# Patient Record
Sex: Female | Born: 1990 | Race: Black or African American | Hispanic: No | Marital: Single | Smoking: Never smoker
Health system: Southern US, Community
[De-identification: ages and names within clinical notes are randomized; demographics above are authoritative.]

## PROBLEM LIST (undated history)

## (undated) DIAGNOSIS — Z789 Other specified health status: Secondary | ICD-10-CM

## (undated) HISTORY — PX: NO PAST SURGERIES: SHX2092

---

## 2012-05-27 ENCOUNTER — Emergency Department (HOSPITAL_COMMUNITY)
Admission: EM | Admit: 2012-05-27 | Discharge: 2012-05-27 | Disposition: A | Discharge status: Home / Self Care | Payer: BC Managed Care – PPO | Attending: Emergency Medicine | Admitting: Emergency Medicine

## 2012-05-27 ENCOUNTER — Encounter (HOSPITAL_COMMUNITY): Payer: Self-pay | Admitting: *Deleted

## 2012-05-27 DIAGNOSIS — R109 Unspecified abdominal pain: Secondary | ICD-10-CM | POA: Insufficient documentation

## 2012-05-27 LAB — URINALYSIS, ROUTINE W REFLEX MICROSCOPIC
Glucose, UA: NEGATIVE mg/dL
Ketones, ur: NEGATIVE mg/dL
Protein, ur: NEGATIVE mg/dL

## 2012-05-27 LAB — COMPREHENSIVE METABOLIC PANEL
Alkaline Phosphatase: 59 U/L (ref 39–117)
BUN: 6 mg/dL (ref 6–23)
Calcium: 9.4 mg/dL (ref 8.4–10.5)
GFR calc Af Amer: >90 mL/min (ref >90)
GFR calc non Af Amer: >90 mL/min (ref >90)
Glucose, Bld: 105 mg/dL — ABNORMAL HIGH (ref 70–99)
Potassium: 4.3 mEq/L (ref 3.5–5.1)
Total Protein: 7.3 g/dL (ref 6.0–8.3)

## 2012-05-27 LAB — POCT PREGNANCY, URINE: Preg Test, Ur: NEGATIVE

## 2012-05-27 LAB — WET PREP, GENITAL
Trich, Wet Prep: NONE SEEN
Yeast Wet Prep HPF POC: NONE SEEN

## 2012-05-27 LAB — CBC WITH DIFFERENTIAL/PLATELET
Basophils Absolute: 0 10*3/uL (ref 0.0–0.1)
HCT: 36.8 % (ref 36.0–46.0)
Lymphocytes Relative: 5 % — ABNORMAL LOW (ref 12–46)
Monocytes Absolute: 0.4 10*3/uL (ref 0.1–1.0)
Neutro Abs: 9 10*3/uL — ABNORMAL HIGH (ref 1.7–7.7)
Neutrophils Relative %: 91 % — ABNORMAL HIGH (ref 43–77)
RDW: 12 % (ref 11.5–15.5)
WBC: 9.9 10*3/uL (ref 4.0–10.5)

## 2012-05-27 MED ORDER — IBUPROFEN 800 MG PO TABS: 800.0000 mg | ORAL_TABLET | Freq: Three times a day (TID) | ORAL | Status: AC

## 2012-05-27 NOTE — ED Notes (Signed)
Pt reports being on her menstrual cycle, having severe lower abd cramping today and n/v. Denies diarrhea or urinary symptoms. No acute distress noted at triage.

## 2012-05-27 NOTE — ED Provider Notes (Signed)
History     CSN: [id number]  Arrival date & time 05/27/12  1411   First MD Initiated Contact with Patient 05/27/12 1522      Chief Complaint  Patient presents with  . Abdominal Pain  . Emesis    (Consider location/radiation/quality/duration/timing/severity/associated sxs/prior treatment) HPI Comments: Patient presents today with a chief complaint of abdominal pain that began this evening.  Pain located in the suprapubic area.  She started her menstrual cycle earlier today.  She reports that the pain feels like a sharp crampy pain.  This pain is different and more severe than the pain that she typically has with her menstrual cycle.  She also reports that she had one episode of vomiting earlier today.  No blood in her emesis.  She denies diarrhea.  She had a regular soft BM this morning.  No blood in the stool.  No prior abdominal surgeries.    Patient is a 21 y.o. female presenting with abdominal pain. The history is provided by the patient.  Abdominal Pain The primary symptoms of the illness include abdominal pain, nausea and vomiting. The primary symptoms of the illness do not include fever, shortness of breath, diarrhea, hematemesis, hematochezia, dysuria or vaginal discharge. The onset of the illness was gradual. The problem has been gradually worsening.  The patient states that she believes she is currently not pregnant. The patient has not had a change in bowel habit. Symptoms associated with the illness do not include chills, constipation, urgency or frequency.    History reviewed. No pertinent past medical history.  History reviewed. No pertinent past surgical history.  History reviewed. No pertinent family history.  History  Substance Use Topics  . Smoking status: Not on file  . Smokeless tobacco: Not on file  . Alcohol Use: No    OB History    Grav Para Term Preterm Abortions TAB SAB Ect Mult Living                  Review of Systems  Constitutional: Negative for  fever and chills.  Respiratory: Negative for shortness of breath.   Gastrointestinal: Positive for nausea, vomiting and abdominal pain. Negative for diarrhea, constipation, blood in stool, hematochezia, abdominal distention and hematemesis.  Genitourinary: Negative for dysuria, urgency, frequency, flank pain, vaginal discharge, difficulty urinating and vaginal pain.  Neurological: Negative for dizziness, syncope and light-headedness.    Allergies  Review of patient's allergies indicates no known allergies.  Home Medications   Current Outpatient Rx  Name Route Sig Dispense Refill  . HYDROCODONE-ACETAMINOPHEN 5-325 MG PO TABS Oral Take 1 tablet by mouth every 6 (six) hours as needed. For pain    . IBUPROFEN 200 MG PO TABS Oral Take 400 mg by mouth every 6 (six) hours as needed. For pain      BP 118/73  Pulse 62  Temp 97.4 F (36.3 C) (Oral)  Resp 20  SpO2 100%  LMP 05/27/2012  Physical Exam  Nursing note and vitals reviewed. Constitutional: She appears well-developed and well-nourished. No distress.  HENT:  Head: Normocephalic and atraumatic.  Mouth/Throat: Oropharynx is clear and moist.  Neck: Normal range of motion. Neck supple.  Cardiovascular: Normal rate, regular rhythm and normal heart sounds.   Pulmonary/Chest: Effort normal and breath sounds normal. No respiratory distress. She has no wheezes.  Abdominal: Soft. Bowel sounds are normal. She exhibits no distension and no mass. There is tenderness in the suprapubic area. There is no rigidity, no rebound, no guarding, no tenderness  at McBurney's point and negative Murphy's sign.  Genitourinary: There is no rash, tenderness or lesion on the right labia. There is no rash, tenderness or lesion on the left labia. Uterus is not enlarged. Cervix exhibits no motion tenderness and no discharge. Right adnexum displays no mass, no tenderness and no fullness. Left adnexum displays no mass, no tenderness and no fullness. There is bleeding  around the vagina.  Neurological: She is alert.  Skin: Skin is warm and dry. She is not diaphoretic.  Psychiatric: She has a normal mood and affect.    ED Course  Procedures (including critical care time)  Labs Reviewed  URINALYSIS, ROUTINE W REFLEX MICROSCOPIC - Abnormal; Notable for the following:    APPearance CLOUDY (*)     Hgb urine dipstick LARGE (*)     All other components within normal limits  URINE MICROSCOPIC-ADD ON  POCT PREGNANCY, URINE  GC/CHLAMYDIA PROBE AMP, GENITAL  WET PREP, GENITAL  COMPREHENSIVE METABOLIC PANEL  CBC WITH DIFFERENTIAL   No results found.   No diagnosis found.    MDM  Patient presenting today with lower abdominal sharp cramping.  Patient started her menstrual cycle earlier today.  No tenderness on pelvic exam.  No rebound or guarding on abdominal exam.  Patient afebrile.  Labs unremarkable.  UA negative.  Urine pregnancy negative.  Therefore, do not feel that any imaging is indicated at this time.  Patient discharged home.  Return precautions discussed with the patient.          Pascal Lux [place], PA-C 05/28/12 1217

## 2012-05-28 NOTE — ED Provider Notes (Signed)
Medical screening examination/treatment/procedure(s) were performed by non-physician practitioner and as supervising physician I was immediately available for consultation/collaboration.   Loren Racer, MD 05/28/12 [phone]

## 2012-05-29 LAB — GC/CHLAMYDIA PROBE AMP, GENITAL: GC Probe Amp, Genital: NEGATIVE

## 2013-02-08 ENCOUNTER — Encounter (HOSPITAL_COMMUNITY): Payer: Self-pay | Admitting: Emergency Medicine

## 2013-02-08 ENCOUNTER — Emergency Department (HOSPITAL_COMMUNITY)
Admission: EM | Admit: 2013-02-08 | Discharge: 2013-02-08 | Disposition: A | Discharge status: Home / Self Care | Payer: BC Managed Care – PPO | Attending: Emergency Medicine | Admitting: Emergency Medicine

## 2013-02-08 DIAGNOSIS — IMO0002 Reserved for concepts with insufficient information to code with codable children: Secondary | ICD-10-CM | POA: Insufficient documentation

## 2013-02-08 DIAGNOSIS — S199XXA Unspecified injury of neck, initial encounter: Secondary | ICD-10-CM | POA: Insufficient documentation

## 2013-02-08 DIAGNOSIS — Y92410 Unspecified street and highway as the place of occurrence of the external cause: Secondary | ICD-10-CM | POA: Insufficient documentation

## 2013-02-08 DIAGNOSIS — Y939 Activity, unspecified: Secondary | ICD-10-CM | POA: Insufficient documentation

## 2013-02-08 DIAGNOSIS — S0993XA Unspecified injury of face, initial encounter: Secondary | ICD-10-CM | POA: Insufficient documentation
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
## 2013-02-09 NOTE — ED Provider Notes (Signed)
Medical screening examination/treatment/procedure(s) were performed by non-physician practitioner and as supervising physician I was immediately available for consultation/collaboration.   Michal Callicott III, MD 02/09/13 1234 

## 2015-01-19 ENCOUNTER — Emergency Department (INDEPENDENT_AMBULATORY_CARE_PROVIDER_SITE_OTHER)
Admission: EM | Admit: 2015-01-19 | Discharge: 2015-01-19 | Disposition: A | Discharge status: Home / Self Care | Payer: PRIVATE HEALTH INSURANCE | Source: Home / Self Care | Attending: Family Medicine | Admitting: Family Medicine

## 2015-01-19 ENCOUNTER — Encounter (HOSPITAL_COMMUNITY): Payer: Self-pay | Admitting: Emergency Medicine

## 2015-01-19 DIAGNOSIS — N390 Urinary tract infection, site not specified: Secondary | ICD-10-CM

## 2015-01-19 LAB — POCT PREGNANCY, URINE: PREG TEST UR: NEGATIVE

## 2015-01-19 LAB — POCT URINALYSIS DIP (DEVICE)
Bilirubin Urine: NEGATIVE
GLUCOSE, UA: NEGATIVE mg/dL
KETONES UR: NEGATIVE mg/dL
NITRITE: NEGATIVE
PH: 5.5 (ref 5.0–8.0)
Protein, ur: 30 mg/dL — AB
Specific Gravity, Urine: 1.015 (ref 1.005–1.030)
Urobilinogen, UA: 0.2 mg/dL (ref 0.0–1.0)

## 2015-01-19 MED ORDER — CEFUROXIME AXETIL 250 MG PO TABS: 250.0000 mg | ORAL_TABLET | Freq: Two times a day (BID) | ORAL | Status: DC

## 2015-01-19 NOTE — ED Notes (Signed)
C/o  Dysuria and hematuria.  On set Friday.  Denies lower back pain.  No fever, n/v/d.  Pt is taking uristat with no relief.

## 2015-01-19 NOTE — ED Provider Notes (Signed)
CSN: [id number]     Arrival date & time 01/19/15  1546 History   First MD Initiated Contact with Patient 01/19/15 1631     Chief Complaint  Patient presents with  . Urinary Tract Infection   (Consider location/radiation/quality/duration/timing/severity/associated sxs/prior Treatment) HPI          24 year old female presents complaining of lower abdominal discomfort, hematuria, dysuria, frequency, urgency. Symptoms started 3 days ago and have waxed and waned since then. She has been taking over-the-counter AZO with some relief of her symptoms as well as increasing fluids. This afternoon she felt like her symptoms were getting much worse so she wanted to be checked out. No fever, NVD, or flank pain  History reviewed. No pertinent past medical history. History reviewed. No pertinent past surgical history. No family history on file. History  Substance Use Topics  . Smoking status: Never Smoker   . Smokeless tobacco: Not on file  . Alcohol Use: Yes   OB History    No data available     Review of Systems  Genitourinary: Positive for dysuria, urgency, frequency and hematuria. Negative for flank pain, vaginal discharge, vaginal pain and pelvic pain.  All other systems reviewed and are negative.   Allergies  Review of patient's allergies indicates no known allergies.  Home Medications   Prior to Admission medications   Medication Sig Start Date End Date Taking? Authorizing Provider  cefUROXime (CEFTIN) 250 MG tablet Take 1 tablet (250 mg total) by mouth 2 (two) times daily with a meal. 01/19/15   Graylon GoodZachary H Terrelle Ruffolo, PA-C  ibuprofen (ADVIL,MOTRIN) 200 MG tablet Take 200 mg by mouth every 8 (eight) hours as needed for pain.    Historical Provider, MD   BP 111/67 mmHg  Pulse 66  Temp(Src) 99 F (37.2 C) (Oral)  Resp 16  SpO2 99%  LMP 12/21/2014 Physical Exam  Constitutional: She is oriented to person, place, and time. Vital signs are normal. She appears well-developed and  well-nourished. No distress.  HENT:  Head: Normocephalic and atraumatic.  Pulmonary/Chest: Effort normal. No respiratory distress.  Abdominal: Soft. Bowel sounds are normal. There is no tenderness. There is no rebound, no guarding and no CVA tenderness.  Neurological: She is alert and oriented to person, place, and time. She has normal strength. Coordination normal.  Skin: Skin is warm and dry. No rash noted. She is not diaphoretic.  Psychiatric: She has a normal mood and affect. Judgment normal.  Nursing note and vitals reviewed.   ED Course  Procedures (including critical care time) Labs Review Labs Reviewed  POCT URINALYSIS DIP (DEVICE) - Abnormal; Notable for the following:    Hgb urine dipstick LARGE (*)    Protein, ur 30 (*)    Leukocytes, UA MODERATE (*)    All other components within normal limits  URINE CULTURE  POCT PREGNANCY, URINE    Imaging Review No results found.   MDM   1. UTI (lower urinary tract infection)    Urine culture sent. Treat with Ceftin, increase fluids, continue over-the-counter AZO when necessary. Follow-up if any worsening   Meds ordered this encounter  Medications  . cefUROXime (CEFTIN) 250 MG tablet    Sig: Take 1 tablet (250 mg total) by mouth 2 (two) times daily with a meal.    Dispense:  10 tablet    Refill:  0       Graylon GoodZachary H Lorena Benham, PA-C 01/19/15 1654

## 2015-01-19 NOTE — Discharge Instructions (Signed)

## 2015-01-22 LAB — URINE CULTURE

## 2015-01-22 NOTE — ED Notes (Signed)
Urine culture: >100,000 colonies E. Coli.  Pt. adequately treated with Ceftin. erythromycin 01/22/2015

## 2015-01-23 ENCOUNTER — Emergency Department (HOSPITAL_COMMUNITY): Payer: No Typology Code available for payment source

## 2015-01-23 ENCOUNTER — Encounter (HOSPITAL_COMMUNITY): Payer: Self-pay | Admitting: Emergency Medicine

## 2015-01-23 ENCOUNTER — Emergency Department (HOSPITAL_COMMUNITY)
Admission: EM | Admit: 2015-01-23 | Discharge: 2015-01-23 | Disposition: A | Discharge status: Home / Self Care | Payer: No Typology Code available for payment source | Attending: Emergency Medicine | Admitting: Emergency Medicine

## 2015-01-23 DIAGNOSIS — Y92410 Unspecified street and highway as the place of occurrence of the external cause: Secondary | ICD-10-CM | POA: Diagnosis not present

## 2015-01-23 DIAGNOSIS — S299XXA Unspecified injury of thorax, initial encounter: Secondary | ICD-10-CM | POA: Diagnosis not present

## 2015-01-23 DIAGNOSIS — S3992XA Unspecified injury of lower back, initial encounter: Secondary | ICD-10-CM | POA: Insufficient documentation

## 2015-01-23 DIAGNOSIS — Z792 Long term (current) use of antibiotics: Secondary | ICD-10-CM | POA: Insufficient documentation

## 2015-01-23 DIAGNOSIS — Y9389 Activity, other specified: Secondary | ICD-10-CM | POA: Diagnosis not present

## 2015-01-23 DIAGNOSIS — S0990XA Unspecified injury of head, initial encounter: Secondary | ICD-10-CM | POA: Diagnosis not present

## 2015-01-23 DIAGNOSIS — Y998 Other external cause status: Secondary | ICD-10-CM | POA: Insufficient documentation

## 2015-01-23 MED ORDER — IBUPROFEN 800 MG PO TABS: 800.0000 mg | ORAL_TABLET | Freq: Three times a day (TID) | ORAL | Status: DC

## 2015-01-23 MED ORDER — CYCLOBENZAPRINE HCL 10 MG PO TABS: 10.0000 mg | ORAL_TABLET | Freq: Two times a day (BID) | ORAL | Status: DC | PRN

## 2015-01-23 NOTE — Discharge Instructions (Signed)
Take ibuprofen as needed for pain. Take flexeril as needed for muscle spasm. You may take these medications. Refer to attached documents for more information.
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
## 2015-06-20 ENCOUNTER — Encounter (HOSPITAL_COMMUNITY): Payer: Self-pay | Admitting: *Deleted

## 2015-06-20 ENCOUNTER — Inpatient Hospital Stay (HOSPITAL_COMMUNITY): Payer: No Typology Code available for payment source

## 2015-06-20 ENCOUNTER — Inpatient Hospital Stay (HOSPITAL_COMMUNITY)
Admission: AD | Admit: 2015-06-20 | Discharge: 2015-06-20 | Disposition: A | Discharge status: Home / Self Care | Payer: No Typology Code available for payment source | Source: Ambulatory Visit | Attending: Obstetrics and Gynecology | Admitting: Obstetrics and Gynecology

## 2015-06-20 DIAGNOSIS — N8329 Other ovarian cysts: Secondary | ICD-10-CM

## 2015-06-20 DIAGNOSIS — N8320 Unspecified ovarian cysts: Secondary | ICD-10-CM | POA: Insufficient documentation

## 2015-06-20 DIAGNOSIS — R102 Pelvic and perineal pain: Secondary | ICD-10-CM | POA: Diagnosis not present

## 2015-06-20 DIAGNOSIS — N83209 Unspecified ovarian cyst, unspecified side: Secondary | ICD-10-CM

## 2015-06-20 DIAGNOSIS — R109 Unspecified abdominal pain: Secondary | ICD-10-CM | POA: Diagnosis present

## 2015-06-20 HISTORY — DX: Other specified health status: Z78.9

## 2015-06-20 LAB — URINE MICROSCOPIC-ADD ON

## 2015-06-20 LAB — CBC
HCT: 37.8 % (ref 36.0–46.0)
Hemoglobin: 12.9 g/dL (ref 12.0–15.0)
MCH: 29.4 pg (ref 26.0–34.0)
MCHC: 34.1 g/dL (ref 30.0–36.0)
MCV: 86.1 fL (ref 78.0–100.0)
PLATELETS: 166 10*3/uL (ref 150–400)
RBC: 4.39 MIL/uL (ref 3.87–5.11)
RDW: 12.2 % (ref 11.5–15.5)
WBC: 11.9 10*3/uL — AB (ref 4.0–10.5)

## 2015-06-20 LAB — URINALYSIS, ROUTINE W REFLEX MICROSCOPIC
Bilirubin Urine: NEGATIVE
GLUCOSE, UA: NEGATIVE mg/dL
KETONES UR: NEGATIVE mg/dL
LEUKOCYTES UA: NEGATIVE
NITRITE: NEGATIVE
PROTEIN: NEGATIVE mg/dL
Specific Gravity, Urine: >1.03 — ABNORMAL HIGH (ref 1.005–1.030)
UROBILINOGEN UA: 0.2 mg/dL (ref 0.0–1.0)
pH: 6 (ref 5.0–8.0)

## 2015-06-20 LAB — POCT PREGNANCY, URINE: PREG TEST UR: NEGATIVE

## 2015-06-20 MED ORDER — KETOROLAC TROMETHAMINE 60 MG/2ML IM SOLN
60.0000 mg | Freq: Once | INTRAMUSCULAR | Status: AC
  Administered 2015-06-20: 60 mg via INTRAMUSCULAR
  Filled 2015-06-20: qty 2

## 2015-06-20 NOTE — Discharge Instructions (Signed)
At this time, you do not have an ovarian cyst. The following education is to explain the different kinds of cysts     Ovarian Cyst An ovarian cyst is a fluid-filled sac that forms on an ovary. The ovaries are small organs that produce eggs in women. Various types of cysts can form on the ovaries. Most are not cancerous. Many do not cause problems, and they often go away on their own. Some may cause symptoms and require treatment. Common types of ovarian cysts include:  Functional cysts--These cysts may occur every month during the menstrual cycle. This is normal. The cysts usually go away with the next menstrual cycle if the woman does not get pregnant. Usually, there are no symptoms with a functional cyst.  Endometrioma cysts--These cysts form from the tissue that lines the uterus. They are also called "chocolate cysts" because they become filled with blood that turns brown. This type of cyst can cause pain in the lower abdomen during intercourse and with your menstrual period.  Cystadenoma cysts--This type develops from the cells on the outside of the ovary. These cysts can get very big and cause lower abdomen pain and pain with intercourse. This type of cyst can twist on itself, cut off its blood supply, and cause severe pain. It can also easily rupture and cause a lot of pain.  Dermoid cysts--This type of cyst is sometimes found in both ovaries. These cysts may contain different kinds of body tissue, such as skin, teeth, hair, or cartilage. They usually do not cause symptoms unless they get very big.  Theca lutein cysts--These cysts occur when too much of a certain hormone (human chorionic gonadotropin) is produced and overstimulates the ovaries to produce an egg. This is most common after procedures used to assist with the conception of a baby (in vitro fertilization). CAUSES   Fertility drugs can cause a condition in which multiple large cysts are formed on the ovaries. This is called  ovarian hyperstimulation syndrome.  A condition called polycystic ovary syndrome can cause hormonal imbalances that can lead to nonfunctional ovarian cysts. SIGNS AND SYMPTOMS  Many ovarian cysts do not cause symptoms. If symptoms are present, they may include:  Pelvic pain or pressure.  Pain in the lower abdomen.  Pain during sexual intercourse.  Increasing girth (swelling) of the abdomen.  Abnormal menstrual periods.  Increasing pain with menstrual periods.  Stopping having menstrual periods without being pregnant. DIAGNOSIS  These cysts are commonly found during a routine or annual pelvic exam. Tests may be ordered to find out more about the cyst. These tests may include:  Ultrasound.  X-ray of the pelvis.  CT scan.  MRI.  Blood tests. TREATMENT  Many ovarian cysts go away on their own without treatment. Your health care provider may want to check your cyst regularly for 2-3 months to see if it changes. For women in menopause, it is particularly important to monitor a cyst closely because of the higher rate of ovarian cancer in menopausal women. When treatment is needed, it may include any of the following:  A procedure to drain the cyst (aspiration). This may be done using a long needle and ultrasound. It can also be done through a laparoscopic procedure. This involves using a thin, lighted tube with a tiny camera on the end (laparoscope) inserted through a small incision.  Surgery to remove the whole cyst. This may be done using laparoscopic surgery or an open surgery involving a larger incision in the lower abdomen.  Hormone treatment or birth control pills. These methods are sometimes used to help dissolve a cyst. HOME CARE INSTRUCTIONS   Only take over-the-counter or prescription medicines as directed by your health care provider.  Follow up with your health care provider as directed.  Get regular pelvic exams and Pap tests. SEEK MEDICAL CARE IF:   Your periods  are late, irregular, or painful, or they stop.  Your pelvic pain or abdominal pain does not go away.  Your abdomen becomes larger or swollen.  You have pressure on your bladder or trouble emptying your bladder completely.  You have pain during sexual intercourse.  You have feelings of fullness, pressure, or discomfort in your stomach.  You lose weight for no apparent reason.  You feel generally ill.  You become constipated.  You lose your appetite.  You develop acne.  You have an increase in body and facial hair.  You are gaining weight, without changing your exercise and eating habits.  You think you are pregnant. SEEK IMMEDIATE MEDICAL CARE IF:   You have increasing abdominal pain.  You feel sick to your stomach (nauseous), and you throw up (vomit).  You develop a fever that comes on suddenly.  You have abdominal pain during a bowel movement.  Your menstrual periods become heavier than usual. MAKE SURE YOU:  Understand these instructions.  Will watch your condition.  Will get help right away if you are not doing well or get worse. Document Released: 10/11/2005 Document Revised: 10/16/2013 Document Reviewed: 06/18/2013 [facility] Patient Information 2015 [place], [place]. This information is not intended to replace advice given to you by your health care provider. Make sure you discuss any questions you have with your health care provider.     Abdominal Pain, Women Abdominal (stomach, pelvic, or belly) pain can be caused by many things. It is important to tell your doctor:  The location of the pain.  Does it come and go or is it present all the time?  Are there things that start the pain (eating certain foods, exercise)?  Are there other symptoms associated with the pain (fever, nausea, vomiting, diarrhea)? All of this is helpful to know when trying to find the cause of the pain. CAUSES   Stomach: virus or bacteria infection, or ulcer.  Intestine:  appendicitis (inflamed appendix), regional ileitis (Crohn's disease), ulcerative colitis (inflamed colon), irritable bowel syndrome, diverticulitis (inflamed diverticulum of the colon), or cancer of the stomach or intestine.  Gallbladder disease or stones in the gallbladder.  Kidney disease, kidney stones, or infection.  Pancreas infection or cancer.  Fibromyalgia (pain disorder).  Diseases of the female organs:  Uterus: fibroid (non-cancerous) tumors or infection.  Fallopian tubes: infection or tubal pregnancy.  Ovary: cysts or tumors.  Pelvic adhesions (scar tissue).  Endometriosis (uterus lining tissue growing in the pelvis and on the pelvic organs).  Pelvic congestion syndrome (female organs filling up with blood just before the menstrual period).  Pain with the menstrual period.  Pain with ovulation (producing an egg).  Pain with an IUD (intrauterine device, birth control) in the uterus.  Cancer of the female organs.  Functional pain (pain not caused by a disease, may improve without treatment).  Psychological pain.  Depression. DIAGNOSIS  Your doctor will decide the seriousness of your pain by doing an examination.  Blood tests.  X-rays.  Ultrasound.  CT scan (computed tomography, special type of X-ray).  MRI (magnetic resonance imaging).  Cultures, for infection.  Barium enema (dye inserted in the  large intestine, to better view it with X-rays).  Colonoscopy (looking in intestine with a lighted tube).  Laparoscopy (minor surgery, looking in abdomen with a lighted tube).  Major abdominal exploratory surgery (looking in abdomen with a large incision). TREATMENT  The treatment will depend on the cause of the pain.   Many cases can be observed and treated at home.  Over-the-counter medicines recommended by your caregiver.  Prescription medicine.  Antibiotics, for infection.  Birth control pills, for painful periods or for ovulation  pain.  Hormone treatment, for endometriosis.  Nerve blocking injections.  Physical therapy.  Antidepressants.  Counseling with a psychologist or psychiatrist.  Minor or major surgery. HOME CARE INSTRUCTIONS   Do not take laxatives, unless directed by your caregiver.  Take over-the-counter pain medicine only if ordered by your caregiver. Do not take aspirin because it can cause an upset stomach or bleeding.  Try a clear liquid diet (broth or water) as ordered by your caregiver. Slowly move to a bland diet, as tolerated, if the pain is related to the stomach or intestine.  Have a thermometer and take your temperature several times a day, and record it.  Bed rest and sleep, if it helps the pain.  Avoid sexual intercourse, if it causes pain.  Avoid stressful situations.  Keep your follow-up appointments and tests, as your caregiver orders.  If the pain does not go away with medicine or surgery, you may try:  Acupuncture.  Relaxation exercises (yoga, meditation).  Group therapy.  Counseling. SEEK MEDICAL CARE IF:   You notice certain foods cause stomach pain.  Your home care treatment is not helping your pain.  You need stronger pain medicine.  You want your IUD removed.  You feel faint or lightheaded.  You develop nausea and vomiting.  You develop a rash.  You are having side effects or an allergy to your medicine. SEEK IMMEDIATE MEDICAL CARE IF:   Your pain does not go away or gets worse.  You have a fever.  Your pain is felt only in portions of the abdomen. The right side could possibly be appendicitis. The left lower portion of the abdomen could be colitis or diverticulitis.  You are passing blood in your stools (bright red or black tarry stools, with or without vomiting).  You have blood in your urine.  You develop chills, with or without a fever.  You pass out. MAKE SURE YOU:   Understand these instructions.  Will watch your  condition.  Will get help right away if you are not doing well or get worse. Document Released: 08/08/2007 Document Revised: 02/25/2014 Document Reviewed: 08/28/2009 [facility] Patient Information 2015 [place], [place]. This information is not intended to replace advice given to you by your health care provider. Make sure you discuss any questions you have with your health care provider.

## 2015-06-20 NOTE — MAU Provider Note (Signed)
Chief Complaint: Abdominal Pain   First Provider Initiated Contact with Patient 06/20/15 1732      SUBJECTIVE HPI: Nicole Huff is a 24 y.o. G0P0000 who presents to maternity admissions reporting diagnosis of right ovarian cyst in the office yesterday, then episode today at 3pm of lightheadedness, nausea, and an increase in pain.  Nausea and lightheadedness have resolved but pain continues, described as burning and cramping pain in the middle of her lower abdomen that is constant.  She took Vicodin that was prescribed today but it did not reduce her pain.  She reports her vaginal bleeding has been irregular with at least light bleeding almost every day since her Nexplanon was placed in April. She had pelvic exam in the office yesterday but no ultrasound.  She was prescribed OCPs yesterday in the office for bleeding/cyst.   She denies vaginal itching/burning, urinary symptoms, h/a, dizziness, n/v, or fever/chills.     Abdominal Pain This is a new problem. The current episode started yesterday. The onset quality is sudden. The problem occurs intermittently. The problem has been waxing and waning. The pain is located in the suprapubic region. The pain is severe. The quality of the pain is cramping and burning. The abdominal pain does not radiate. Associated symptoms include nausea. Pertinent negatives include no constipation, diarrhea, dysuria, fever, flatus, frequency, headaches or vomiting. She has tried oral narcotic analgesics for the symptoms. The treatment provided no relief.    Past Medical History  Diagnosis Date  . Medical history non-contributory    Past Surgical History  Procedure Laterality Date  . No past surgeries     Social History   Social History  . Marital Status: Single    Spouse Name: N/A  . Number of Children: N/A  . Years of Education: N/A   Occupational History  . Not on file.   Social History Main Topics  . Smoking status: Never Smoker   . Smokeless  tobacco: Not on file  . Alcohol Use: Yes  . Drug Use: No  . Sexual Activity: Yes    Birth Control/ Protection: Implant, Pill   Other Topics Concern  . Not on file   Social History Narrative   No current facility-administered medications on file prior to encounter.   No current outpatient prescriptions on file prior to encounter.   No Known Allergies  ROS:  Review of Systems  Constitutional: Negative for fever, chills and fatigue.  HENT: Negative for sinus pressure.   Eyes: Negative for photophobia.  Respiratory: Negative for shortness of breath.   Cardiovascular: Negative for chest pain.  Gastrointestinal: Positive for nausea and abdominal pain. Negative for vomiting, diarrhea, constipation and flatus.  Genitourinary: Negative for dysuria, frequency, flank pain, vaginal bleeding, vaginal discharge, difficulty urinating, vaginal pain and pelvic pain.  Musculoskeletal: Negative for neck pain.  Neurological: Negative for dizziness, weakness and headaches.  Psychiatric/Behavioral: Negative.      I have reviewed patient's Past Medical Hx, Surgical Hx, Family Hx, Social Hx, medications and allergies.   Physical Exam  Patient Vitals for the past 24 hrs:  BP Temp Temp src Pulse Resp Height Weight  06/20/15 1658 109/78 mmHg 97.3 F (36.3 C) Oral (!) 57 16 5\' 7"  (1.702 m) 50.621 kg (111 lb 9.6 oz)   Constitutional: Well-developed, well-nourished female in no acute distress.  Cardiovascular: normal rate Respiratory: normal effort GI: Abd soft, non-tender. No rebound tenderness or guarding.  Pos BS x 4 MS: Extremities nontender, no edema, normal ROM Neurologic: Alert and  oriented x 4.  GU: Neg CVAT.  PELVIC EXAM: Deferred--done 06/19/15 in office   LAB RESULTS Results for orders placed or performed during the hospital encounter of 06/20/15 (from the past 24 hour(s))  Urinalysis, Routine w reflex microscopic (not at [facility])     Status: Abnormal   Collection Time: 06/20/15  5:00  PM  Result Value Ref Range   Color, Urine YELLOW YELLOW   APPearance CLEAR CLEAR   Specific Gravity, Urine >1.030 (H) 1.005 - 1.030   pH 6.0 5.0 - 8.0   Glucose, UA NEGATIVE NEGATIVE mg/dL   Hgb urine dipstick MODERATE (A) NEGATIVE   Bilirubin Urine NEGATIVE NEGATIVE   Ketones, ur NEGATIVE NEGATIVE mg/dL   Protein, ur NEGATIVE NEGATIVE mg/dL   Urobilinogen, UA 0.2 0.0 - 1.0 mg/dL   Nitrite NEGATIVE NEGATIVE   Leukocytes, UA NEGATIVE NEGATIVE  Urine microscopic-add on     Status: Abnormal   Collection Time: 06/20/15  5:00 PM  Result Value Ref Range   Squamous Epithelial / LPF RARE RARE   WBC, UA 3-6 <3 WBC/hpf   RBC / HPF 3-6 <3 RBC/hpf   Bacteria, UA FEW (A) RARE   Urine-Other MUCOUS PRESENT   Pregnancy, urine POC     Status: None   Collection Time: 06/20/15  5:08 PM  Result Value Ref Range   Preg Test, Ur NEGATIVE NEGATIVE  CBC     Status: Abnormal   Collection Time: 06/20/15  5:25 PM  Result Value Ref Range   WBC 11.9 (H) 4.0 - 10.5 K/uL   RBC 4.39 3.87 - 5.11 MIL/uL   Hemoglobin 12.9 12.0 - 15.0 g/dL   HCT 54.0 98.1 - 19.1 %   MCV 86.1 78.0 - 100.0 fL   MCH 29.4 26.0 - 34.0 pg   MCHC 34.1 30.0 - 36.0 g/dL   RDW 47.8 29.5 - 62.1 %   Platelets 166 150 - 400 K/uL       IMAGING US Transvaginal Non-ob  06/20/2015   CLINICAL DATA:  Lower midline pain since yesterday.  EXAM: TRANSABDOMINAL AND TRANSVAGINAL ULTRASOUND OF PELVIS  TECHNIQUE: Both transabdominal and transvaginal ultrasound examinations of the pelvis were performed. Transabdominal technique was performed for global imaging of the pelvis including uterus, ovaries, adnexal regions, and pelvic cul-de-sac. It was necessary to proceed with endovaginal exam following the transabdominal exam to visualize the uterus, ovaries, and adnexa.  COMPARISON:  None  FINDINGS: Uterus  Measurements: 7.8 x 3.6 x 5.8 cm. No fibroids or other mass visualized.  Endometrium  Thickness: Normal, 5 mm.  No focal abnormality visualized.   Right ovary  Measurements: 3.0 x 1.9 x 1.9 cm. Normal appearance/no adnexal mass.  Left ovary  Measurements: 2.6 x 1.6 x 2.1 cm. Normal appearance/no adnexal mass.  Other findings  Trace free pelvic fluid is likely physiologic.  IMPRESSION: Normal pelvic ultrasound for age.   Electronically Signed   By: Jeronimo Greaves M.D.   On: 06/20/2015 19:11   US Pelvis Complete  06/20/2015   CLINICAL DATA:  Lower midline pain since yesterday.  EXAM: TRANSABDOMINAL AND TRANSVAGINAL ULTRASOUND OF PELVIS  TECHNIQUE: Both transabdominal and transvaginal ultrasound examinations of the pelvis were performed. Transabdominal technique was performed for global imaging of the pelvis including uterus, ovaries, adnexal regions, and pelvic cul-de-sac. It was necessary to proceed with endovaginal exam following the transabdominal exam to visualize the uterus, ovaries, and adnexa.  COMPARISON:  None  FINDINGS: Uterus  Measurements: 7.8 x 3.6 x 5.8 cm. No fibroids  or other mass visualized.  Endometrium  Thickness: Normal, 5 mm.  No focal abnormality visualized.  Right ovary  Measurements: 3.0 x 1.9 x 1.9 cm. Normal appearance/no adnexal mass.  Left ovary  Measurements: 2.6 x 1.6 x 2.1 cm. Normal appearance/no adnexal mass.  Other findings  Trace free pelvic fluid is likely physiologic.  IMPRESSION: Normal pelvic ultrasound for age.   Electronically Signed   By: Jeronimo Greaves M.D.   On: 06/20/2015 19:11    MAU Management/MDM: Ordered labs and reviewed results. Treatments in MAU included Toradol 60 mg IM x 1 dose.  ASSESSMENT 1. Acute pelvic pain, female   2. Ruptured ovarian cyst    Pt sent to radiology for pelvic US   Report to Judeth Horn, NP @ 18:40   Sharen Counter Certified Nurse-Midwife 06/20/2015  6:06 PM    1950- C/w Dr. Vincente Poli. Discussed results. Pt to be discharged & discontinue OCP Pt's pain resolved with toradol   P: Discharge home in stable condition Discontinue birth control pills Discussed  reasons to return to MAU or call office    Judeth Horn, NP 06/20/2015 7:55 PM

## 2015-06-20 NOTE — MAU Note (Signed)
Went to dr yesterday, thought it was just  Bad period cramps.  Was told she had a cyst, was given hydrocodone for pain the pill.  Has nexplanon, placed in April. Around 3, started feeling dizzy and nauseous.  Pain is getting really bad, not going away.  Has had irreg bleeding since April, every day for 4 months finally stopped, started again yesterday. (heavier than normal)

## 2016-10-31 ENCOUNTER — Ambulatory Visit (HOSPITAL_COMMUNITY)
Admission: AD | Admit: 2016-10-31 | Discharge: 2016-10-31 | Disposition: A | Discharge status: Home / Self Care | Payer: PRIVATE HEALTH INSURANCE | Attending: Psychiatry | Admitting: Psychiatry

## 2016-10-31 DIAGNOSIS — F329 Major depressive disorder, single episode, unspecified: Secondary | ICD-10-CM | POA: Insufficient documentation

## 2016-10-31 NOTE — BH Assessment (Signed)
Tele Assessment Note   Nicole Huff is an 26 y.o. female who presents to [facility] as a walk in accompanied by her mother, Nicole Huff. Pt provided verbal consent to allow her mother to be present during the assessment. Pt reports she has been severely depressed since her boyfriend broke up with her about 10 days ago. Pt reports she has not been eating or sleeping. Mom and pt were tearful during the assessment and mom expressed that she is worried about the pt due to the fact that she has not been eating or drinking and is possibly dehydrated. Mom reports she took the door knob off of the pt's room in their home so she can check on her because she is worried about her. Pt denies SI and reports she has not desire to hurt herself or anyone else. Mom reports the pt does not come out of her room and she cries very loudly daily and it can be heard throughout the entire house. Pt reports she has been obsessing over her ex-boyfriend and calling him from various phone numbers when he does not answer the phone. Mom reports the pt's ex-boyfriend has reached out to her to ask her to get the pt to stop calling him. Pt reports she calls and texts him daily saying she wants to work things out and he refuses. Pt reports they have been "off again, on again" for their entire 2 and a half year relationship. Pt reports this is her first relationship and the first person she has ever loved. Pt reports she feels that she wants to "get away and escape." Mom reports the pt has been shutting herself off. Pt reports she used to see a therapist when she and her ex-boyfriend broke up in the past but she felt the OPT did not help her situation. Pt reports she feels she will benefit from an intensive- OPT program and she does not feel that she needs to be admitted inpt.  Pt denies AVH and demeanor presents to be sad, sullen, and depressed. Pt was speaking very softly and appeared underweight. Pt denies any current or past issues with eating  disorders or body image issues.   Per Nira Conn, FNP pt does not meet inpt criteria and was provided with resources for the [facility] intensive-OPT program. Pt reports she will contact the office to schedule an appointment and feels she will benefit from intensive-OPT.  Diagnosis: Major Depressive Disorder  Past Medical History:  Past Medical History:  Diagnosis Date   Medical history non-contributory     Past Surgical History:  Procedure Laterality Date   NO PAST SURGERIES      Family History: No family history on file.  Social History:  reports that she has never smoked. She does not have any smokeless tobacco history on file. She reports that she drinks alcohol. She reports that she does not use drugs.  Additional Social History:  Alcohol / Drug Use Pain Medications: See PTA meds  Prescriptions: See PTA meds  Over the Counter: See PTA meds  History of alcohol / drug use?: No history of alcohol / drug abuse  CIWA:   COWS:    PATIENT STRENGTHS: (choose at least two) Average or above average intelligence Capable of independent living Licensed conveyancer Motivation for treatment/growth Supportive family/friends  Allergies: No Known Allergies  Home Medications:  (Not in a hospital admission)  OB/GYN Status:  No LMP recorded.  General Assessment Data Location of Assessment: [facility]  TTS Assessment: In system Is this a Tele or Face-to-Face Assessment?: Face-to-Face Is this an Initial Assessment or a Re-assessment for this encounter?: Initial Assessment Marital status: Single Is patient pregnant?: No Pregnancy Status: No Living Arrangements: Parent Can pt return to current living arrangement?: Yes Admission Status: Voluntary Is patient capable of signing voluntary admission?: Yes Referral Source: Self/Family/Friend Insurance type: [employer]  Medical Screening Exam [facility] Walk-in ONLY) Medical Exam completed: Yes  Crisis Care  Plan Living Arrangements: Parent Name of Psychiatrist: none Name of Therapist: none  Education Status Is patient currently in school?: No Highest grade of school patient has completed: 12th  Risk to self with the past 6 months Suicidal Ideation: No Has patient been a risk to self within the past 6 months prior to admission? : No Suicidal Intent: No Has patient had any suicidal intent within the past 6 months prior to admission? : No Is patient at risk for suicide?: No Suicidal Plan?: No Has patient had any suicidal plan within the past 6 months prior to admission? : No Access to Means: No What has been your use of drugs/alcohol within the last 12 months?: denies Previous Attempts/Gestures: No Triggers for Past Attempts: None known Intentional Self Injurious Behavior: None Family Suicide History: Yes (maternal great grandfather committed suicide) Recent stressful life event(s): Loss (Comment) (reports her boyfriend broke up with her) Persecutory voices/beliefs?: No Depression: Yes Depression Symptoms: Despondent, Tearfulness, Insomnia, Isolating, Fatigue, Guilt, Loss of interest in usual pleasures, Feeling worthless/self pity Substance abuse history and/or treatment for substance abuse?: No Suicide prevention information given to non-admitted patients: Yes  Risk to Others within the past 6 months Homicidal Ideation: No Does patient have any lifetime risk of violence toward others beyond the six months prior to admission? : No Thoughts of Harm to Others: No Current Homicidal Intent: No Current Homicidal Plan: No Access to Homicidal Means: No History of harm to others?: No Assessment of Violence: None Noted Does patient have access to weapons?: No Criminal Charges Pending?: No Does patient have a court date: No Is patient on probation?: No  Psychosis Hallucinations: None noted Delusions: None noted  Mental Status Report Appearance/Hygiene: Disheveled Eye Contact:  Good Motor Activity: Freedom of movement Speech: Logical/coherent Level of Consciousness: Quiet/awake, Crying Mood: Depressed, Despair, Sad, Sullen Affect: Depressed, Flat Anxiety Level: None Thought Processes: Coherent, Relevant Judgement: Partial Orientation: Person, Place, Time, Situation, Appropriate for developmental age Obsessive Compulsive Thoughts/Behaviors: Moderate  Cognitive Functioning Concentration: Normal Memory: Recent Intact, Remote Impaired IQ: Average Insight: Fair Impulse Control: Fair Appetite: Poor Sleep: Decreased Total Hours of Sleep: 5 Vegetative Symptoms: Staying in bed  ADLScreening [facility] Assessment Services) Patient's cognitive ability adequate to safely complete daily activities?: Yes Patient able to express need for assistance with ADLs?: Yes Independently performs ADLs?: Yes (appropriate for developmental age)  Prior Inpatient Therapy Prior Inpatient Therapy: No  Prior Outpatient Therapy Prior Outpatient Therapy: Yes Prior Therapy Dates: 2017 Prior Therapy Facilty/Provider(s): unable to recall Reason for Treatment: MDD Does patient have an ACCT team?: No Does patient have Intensive In-House Services?  : No Does patient have [facility] services? : No Does patient have P4CC services?: No  ADL Screening (condition at time of admission) Patient's cognitive ability adequate to safely complete daily activities?: Yes Is the patient deaf or have difficulty hearing?: No Does the patient have difficulty seeing, even when wearing glasses/contacts?: No Does the patient have difficulty concentrating, remembering, or making decisions?: No Patient able to express need for assistance with ADLs?: Yes Does  the patient have difficulty dressing or bathing?: No Independently performs ADLs?: Yes (appropriate for developmental age) Does the patient have difficulty walking or climbing stairs?: No Weakness of Legs: None Weakness of Arms/Hands: None  Home  Assistive Devices/Equipment Home Assistive Devices/Equipment: None    Abuse/Neglect Assessment (Assessment to be complete while patient is alone) Physical Abuse: Denies Verbal Abuse: Denies Sexual Abuse: Denies Exploitation of patient/patient's resources: Denies Self-Neglect: Denies     Merchant navy officer (For Healthcare) Does Patient Have a Medical Advance Directive?: No Would patient like information on creating a medical advance directive?: No - Patient declined    Additional Information 1:1 In Past 12 Months?: No CIRT Risk: No Elopement Risk: No Does patient have medical clearance?: Yes     Disposition:  Disposition Initial Assessment Completed for this Encounter: Yes Disposition of Patient: Outpatient treatment Type of outpatient treatment: Adult (per Nira Conn, FNP pt referred for intensive OPT svcs)  Karolee Ohs 10/31/2016 9:18 PM

## 2016-11-01 NOTE — H&P (Signed)
Behavioral Health Medical Screening Exam  Nicole Huff is an 26 y.o. female.  Total Time spent with patient: 15 minutes  Psychiatric Specialty Exam: Physical Exam  Constitutional: She is oriented to person, place, and time. She appears well-developed and well-nourished. No distress.  HENT:  Head: Normocephalic and atraumatic.  Right Ear: External ear normal.  Left Ear: External ear normal.  Eyes: Conjunctivae are normal. Right eye exhibits no discharge. Left eye exhibits no discharge. No scleral icterus.  Neck: Normal range of motion.  Cardiovascular: Normal rate, regular rhythm and normal heart sounds.   Respiratory: Effort normal and breath sounds normal. No respiratory distress.  Musculoskeletal: Normal range of motion.  Neurological: She is alert and oriented to person, place, and time.  Skin: Skin is warm and dry. She is not diaphoretic.  Psychiatric: Her speech is normal and behavior is normal. Her mood appears anxious. Her affect is not blunt, not labile and not inappropriate. Thought content is not paranoid and not delusional. Cognition and memory are normal. She expresses impulsivity and inappropriate judgment. She exhibits a depressed mood. She expresses no homicidal and no suicidal ideation.    Review of Systems  Psychiatric/Behavioral: Positive for depression. Negative for hallucinations, memory loss, substance abuse and suicidal ideas. The patient is nervous/anxious and has insomnia.   All other systems reviewed and are negative.   Blood pressure 114/78, pulse 81, temperature 98.4 F (36.9 C), resp. rate 18, SpO2 99 %.There is no height or weight on file to calculate BMI.  General Appearance: Casual  Eye Contact:  Good  Speech:  Clear and Coherent  Volume:  Normal  Mood:  Anxious, Depressed, Hopeless and Worthless  Affect:  Appropriate and Depressed  Thought Process:  Coherent  Orientation:  Full (Time, Place, and Person)  Thought Content:  Hallucinations: None  and Obsessions  Suicidal Thoughts:  No  Homicidal Thoughts:  No  Memory:  Immediate;   Good Recent;   Good Remote;   Good  Judgement:  Fair  Insight:  Fair  Psychomotor Activity:  Normal  Concentration: Concentration: Good and Attention Span: Good  Recall:  Good  Fund of Knowledge:Good  Language: Good  Akathisia:  NA  Handed:  Right  AIMS (if indicated):     Assets:  Communication Skills Desire for Improvement Financial Resources/Insurance Housing Physical Health Transportation  Sleep:       Musculoskeletal: Strength & Muscle Tone: within normal limits Gait & Station: normal Patient leans: N/A  Blood pressure 114/78, pulse 81, temperature 98.4 F (36.9 C), resp. rate 18, SpO2 99 %.  Recommendations:  Based on my evaluation the patient does not appear to have an emergency medical condition. Recommend Intensive outpatient therapy.  Jackelyn PolingJason A Kareli Hossain, NP 11/01/2016, 12:09 AM

## 2017-01-04 ENCOUNTER — Emergency Department (HOSPITAL_COMMUNITY)
Admission: EM | Admit: 2017-01-04 | Discharge: 2017-01-04 | Disposition: A | Discharge status: Home / Self Care | Payer: PRIVATE HEALTH INSURANCE | Attending: Emergency Medicine | Admitting: Emergency Medicine

## 2017-01-04 ENCOUNTER — Emergency Department (HOSPITAL_COMMUNITY): Payer: PRIVATE HEALTH INSURANCE

## 2017-01-04 ENCOUNTER — Encounter (HOSPITAL_COMMUNITY): Payer: Self-pay | Admitting: Emergency Medicine

## 2017-01-04 DIAGNOSIS — M542 Cervicalgia: Secondary | ICD-10-CM | POA: Insufficient documentation

## 2017-01-04 DIAGNOSIS — M546 Pain in thoracic spine: Secondary | ICD-10-CM | POA: Diagnosis not present

## 2017-01-04 DIAGNOSIS — M549 Dorsalgia, unspecified: Secondary | ICD-10-CM
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
## 2017-01-04 NOTE — ED Notes (Signed)
Declined W/C at D/C and was escorted to lobby by RN. 
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
## 2017-01-04 NOTE — Discharge Instructions (Signed)
Xrays taken today were normal Please make appointment with orthopedics if your symptoms are not getting better Take Ibuprofen or Tylenol as needed for pain. You can also use heat on the area
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
## 2019-04-19 ENCOUNTER — Ambulatory Visit: Payer: PRIVATE HEALTH INSURANCE | Admitting: Family Medicine

## 2019-05-03 ENCOUNTER — Ambulatory Visit: Payer: PRIVATE HEALTH INSURANCE | Admitting: Family Medicine

## 2019-08-13 ENCOUNTER — Other Ambulatory Visit: Payer: Self-pay

## 2019-08-13 DIAGNOSIS — Z20822 Contact with and (suspected) exposure to covid-19: Secondary | ICD-10-CM

## 2019-08-15 LAB — NOVEL CORONAVIRUS, NAA: SARS-CoV-2, NAA: NOT DETECTED

## 2019-10-16 DIAGNOSIS — Z681 Body mass index (BMI) 19 or less, adult: Secondary | ICD-10-CM | POA: Diagnosis not present

## 2019-10-16 DIAGNOSIS — Z01419 Encounter for gynecological examination (general) (routine) without abnormal findings: Secondary | ICD-10-CM | POA: Diagnosis not present

## 2019-10-22 ENCOUNTER — Other Ambulatory Visit: Payer: Self-pay

## 2019-10-22 ENCOUNTER — Encounter (HOSPITAL_COMMUNITY): Payer: Self-pay | Admitting: Emergency Medicine

## 2019-10-22 ENCOUNTER — Emergency Department (HOSPITAL_COMMUNITY)
Admission: EM | Admit: 2019-10-22 | Discharge: 2019-10-22 | Disposition: A | Discharge status: Home / Self Care | Payer: BC Managed Care – PPO | Attending: Emergency Medicine | Admitting: Emergency Medicine

## 2019-10-22 DIAGNOSIS — K2900 Acute gastritis without bleeding: Secondary | ICD-10-CM | POA: Insufficient documentation

## 2019-10-22 DIAGNOSIS — R1010 Upper abdominal pain, unspecified: Secondary | ICD-10-CM | POA: Diagnosis not present

## 2019-10-22 LAB — I-STAT BETA HCG BLOOD, ED (MC, WL, AP ONLY): I-stat hCG, quantitative: <5 m[IU]/mL (ref <5)

## 2019-10-22 LAB — LIPASE, BLOOD: Lipase: 21 U/L (ref 11–51)

## 2019-10-22 LAB — COMPREHENSIVE METABOLIC PANEL
ALT: 20 U/L (ref 0–44)
AST: 21 U/L (ref 15–41)
Albumin: 4.2 g/dL (ref 3.5–5.0)
Alkaline Phosphatase: 48 U/L (ref 38–126)
Anion gap: 8 (ref 5–15)
BUN: 11 mg/dL (ref 6–20)
CO2: 26 mmol/L (ref 22–32)
Calcium: 9.6 mg/dL (ref 8.9–10.3)
Chloride: 103 mmol/L (ref 98–111)
Creatinine, Ser: 0.73 mg/dL (ref 0.44–1.00)
GFR calc Af Amer: >60 mL/min (ref >60)
GFR calc non Af Amer: >60 mL/min (ref >60)
Glucose, Bld: 93 mg/dL (ref 70–99)
Potassium: 4.2 mmol/L (ref 3.5–5.1)
Sodium: 137 mmol/L (ref 135–145)
Total Bilirubin: 0.9 mg/dL (ref 0.3–1.2)
Total Protein: 6.5 g/dL (ref 6.5–8.1)

## 2019-10-22 LAB — URINALYSIS, ROUTINE W REFLEX MICROSCOPIC
Bilirubin Urine: NEGATIVE
Glucose, UA: NEGATIVE mg/dL
Hgb urine dipstick: NEGATIVE
Ketones, ur: NEGATIVE mg/dL
Leukocytes,Ua: NEGATIVE
Nitrite: NEGATIVE
Protein, ur: NEGATIVE mg/dL
Specific Gravity, Urine: 1.013 (ref 1.005–1.030)
pH: 6 (ref 5.0–8.0)

## 2019-10-22 LAB — CBC
HCT: 39.6 % (ref 36.0–46.0)
Hemoglobin: 13.3 g/dL (ref 12.0–15.0)
MCH: 29.7 pg (ref 26.0–34.0)
MCHC: 33.6 g/dL (ref 30.0–36.0)
MCV: 88.4 fL (ref 80.0–100.0)
Platelets: 218 10*3/uL (ref 150–400)
RBC: 4.48 MIL/uL (ref 3.87–5.11)
RDW: 11.8 % (ref 11.5–15.5)
WBC: 4.7 10*3/uL (ref 4.0–10.5)
nRBC: 0 % (ref 0.0–0.2)

## 2019-10-22 MED ORDER — ALUM & MAG HYDROXIDE-SIMETH 200-200-20 MG/5ML PO SUSP
30.0000 mL | Freq: Once | ORAL | Status: AC
  Administered 2019-10-22: 10:00:00 30 mL via ORAL
  Filled 2019-10-22: qty 30

## 2019-10-22 MED ORDER — ONDANSETRON HCL 4 MG/2ML IJ SOLN
4.0000 mg | Freq: Once | INTRAMUSCULAR | Status: AC
  Administered 2019-10-22: 4 mg via INTRAVENOUS
  Filled 2019-10-22: qty 2

## 2019-10-22 MED ORDER — SODIUM CHLORIDE 0.9% FLUSH
3.0000 mL | Freq: Once | INTRAVENOUS | Status: AC
  Administered 2019-10-22: 3 mL via INTRAVENOUS

## 2019-10-22 MED ORDER — OMEPRAZOLE 20 MG PO CPDR: 20.0000 mg | DELAYED_RELEASE_CAPSULE | Freq: Every day | ORAL | 0 refills | Status: DC | PRN

## 2019-10-22 MED ORDER — ONDANSETRON 4 MG PO TBDP: 4.0000 mg | ORAL_TABLET | Freq: Three times a day (TID) | ORAL | 0 refills | Status: DC | PRN

## 2019-10-22 MED ORDER — SODIUM CHLORIDE 0.9 % IV BOLUS
1000.0000 mL | Freq: Once | INTRAVENOUS | Status: AC
  Administered 2019-10-22: 10:00:00 1000 mL via INTRAVENOUS

## 2019-10-22 MED ORDER — LIDOCAINE VISCOUS HCL 2 % MT SOLN
15.0000 mL | Freq: Once | OROMUCOSAL | Status: AC
  Administered 2019-10-22: 15 mL via ORAL
  Filled 2019-10-22: qty 15

## 2019-10-22 NOTE — ED Triage Notes (Signed)
Epigastric pain after drinking last night-- no hx of pancreas problems, no hx of ulcers. No vomiting

## 2019-10-22 NOTE — ED Provider Notes (Signed)
[facility] EMERGENCY DEPARTMENT Provider Note   CSN: [id number] Arrival date & time: 10/22/19  0745     History Chief Complaint  Patient presents with  . Abdominal Pain    Nicole Huff is a 28 y.o. female.  HPI Patient presents with upper abdominal pain.  Began around 2 in the morning.  States she did drink alcohol last night.  Has had nausea but did not vomit.  Pain is dull.  Has not had an appetite.  Does not think she is pregnant.  No real radiation of the pain.    Past Medical History:  Diagnosis Date  . Medical history non-contributory     There are no problems to display for this patient.   Past Surgical History:  Procedure Laterality Date  . NO PAST SURGERIES       OB History    Gravida  0   Para  0   Term  0   Preterm  0   AB  0   Living  0     SAB  0   TAB  0   Ectopic  0   Multiple  0   Live Births              No family history on file.  Social History   Tobacco Use  . Smoking status: Never Smoker  . Smokeless tobacco: Never Used  Substance Use Topics  . Alcohol use: Yes    Comment: rarely  . Drug use: No    Home Medications Prior to Admission medications   Medication Sig Start Date End Date Taking? Authorizing Provider  HYDROcodone-acetaminophen (NORCO/VICODIN) 5-325 MG per tablet Take 1 tablet by mouth every 4 (four) hours as needed for moderate pain.    [provider]  ibuprofen (ADVIL,MOTRIN) 200 MG tablet Take 800 mg by mouth every 6 (six) hours as needed for moderate pain.    [provider]  omeprazole (PRILOSEC) 20 MG capsule Take 1 capsule (20 mg total) by mouth daily as needed. 10/22/19   Benjiman Core, MD  ondansetron (ZOFRAN-ODT) 4 MG disintegrating tablet Take 1 tablet (4 mg total) by mouth every 8 (eight) hours as needed for nausea or vomiting. 10/22/19   Benjiman Core, MD    Allergies    Patient has no known allergies.  Review of Systems   Review of  Systems  Constitutional: Positive for appetite change.  HENT: Negative for congestion.   Respiratory: Negative for shortness of breath.   Cardiovascular: Negative for chest pain.  Gastrointestinal: Positive for abdominal pain and nausea.  Genitourinary: Negative for flank pain.  Musculoskeletal: Negative for back pain.  Skin: Negative for rash.  Neurological: Negative for weakness.  Psychiatric/Behavioral: Negative for confusion.    Physical Exam Updated Vital Signs BP 108/75   Pulse (!) 54   Temp 98.2 F (36.8 C) (Oral)   Resp 16   Ht 5\' 7"  (1.702 m)   Wt 50.8 kg   LMP 10/09/2019 (Exact Date)   SpO2 100%   BMI 17.54 kg/m   Physical Exam Vitals and nursing note reviewed.  HENT:     Head: Normocephalic.  Cardiovascular:     Rate and Rhythm: Regular rhythm.  Pulmonary:     Breath sounds: No wheezing or rhonchi.  Abdominal:     Hernia: No hernia is present.     Comments: Mild left upper abdominal tenderness without rebound or guarding.  Skin:    General: Skin is warm.  Capillary Refill: Capillary refill takes less than 2 seconds.  Neurological:     Mental Status: She is alert and oriented to person, place, and time.     ED Results / Procedures / Treatments   Labs (all labs ordered are listed, but only abnormal results are displayed) Labs Reviewed  LIPASE, BLOOD  COMPREHENSIVE METABOLIC PANEL  CBC  URINALYSIS, ROUTINE W REFLEX MICROSCOPIC  I-STAT BETA HCG BLOOD, ED ([facility], [facility], [facility] ONLY)    EKG None  Radiology No results found.  Procedures Procedures (including critical care time)  Medications Ordered in ED Medications  sodium chloride flush (NS) 0.9 % injection 3 mL (3 mLs Intravenous Given 10/22/19 0837)  sodium chloride 0.9 % bolus 1,000 mL (0 mLs Intravenous Stopped 10/22/19 1052)  ondansetron (ZOFRAN) injection 4 mg (4 mg Intravenous Given 10/22/19 0942)  alum & mag hydroxide-simeth (MAALOX/MYLANTA) 200-200-20 MG/5ML suspension 30 mL (30 mLs Oral  Given 10/22/19 0933)    And  lidocaine (XYLOCAINE) 2 % viscous mouth solution 15 mL (15 mLs Oral Given 10/22/19 5465)    ED Course  I have reviewed the triage vital signs and the nursing notes.  Pertinent labs & imaging results that were available during my care of the patient were reviewed by me and considered in my medical decision making (see chart for details).    MDM Rules/Calculators/A&P                      Patient with upper abdominal pain.  Drank a fair amount last night.  Feels better after treatment.  Tolerated orals.  Will discharge home.  Normal lipase. Final Clinical Impression(s) / ED Diagnoses Final diagnoses:  Acute gastritis without hemorrhage, unspecified gastritis type    Rx / DC Orders ED Discharge Orders         Ordered    ondansetron (ZOFRAN-ODT) 4 MG disintegrating tablet  Every 8 hours PRN     10/22/19 1051    omeprazole (PRILOSEC) 20 MG capsule  Daily PRN     10/22/19 1051           Davonna Belling, MD 10/22/19 1636

## 2019-10-22 NOTE — ED Notes (Signed)
Prior note on wrong pt. Disregard. Vitals be taken now.

## 2020-05-07 ENCOUNTER — Telehealth: Payer: Self-pay | Admitting: General Practice

## 2020-05-07 NOTE — Telephone Encounter (Signed)
Called patient and left voicemail for her to callback to get established as a new patient with a provider. 

## 2020-06-20 ENCOUNTER — Encounter: Payer: Self-pay | Admitting: Family Medicine

## 2020-06-20 ENCOUNTER — Ambulatory Visit: Payer: BC Managed Care – PPO | Admitting: Family Medicine

## 2020-06-20 ENCOUNTER — Other Ambulatory Visit: Payer: Self-pay

## 2020-06-20 ENCOUNTER — Telehealth: Payer: Self-pay

## 2020-06-20 VITALS — BP 100/70 | HR 75 | Temp 98.3°F | Ht 67.0 in | Wt 110.5 lb

## 2020-06-20 DIAGNOSIS — Z13220 Encounter for screening for lipoid disorders: Secondary | ICD-10-CM

## 2020-06-20 DIAGNOSIS — R5383 Other fatigue: Secondary | ICD-10-CM

## 2020-06-20 DIAGNOSIS — Z289 Immunization not carried out for unspecified reason: Secondary | ICD-10-CM

## 2020-06-20 DIAGNOSIS — Z7689 Persons encountering health services in other specified circumstances: Secondary | ICD-10-CM

## 2020-06-20 DIAGNOSIS — Z1329 Encounter for screening for other suspected endocrine disorder: Secondary | ICD-10-CM | POA: Diagnosis not present

## 2020-06-20 DIAGNOSIS — R454 Irritability and anger: Secondary | ICD-10-CM

## 2020-06-20 LAB — COMPREHENSIVE METABOLIC PANEL
ALT: 13 U/L (ref 0–35)
AST: 14 U/L (ref 0–37)
Albumin: 4.6 g/dL (ref 3.5–5.2)
Alkaline Phosphatase: 50 U/L (ref 39–117)
BUN: 13 mg/dL (ref 6–23)
CO2: 30 mEq/L (ref 19–32)
Calcium: 9.8 mg/dL (ref 8.4–10.5)
Chloride: 101 mEq/L (ref 96–112)
Creatinine, Ser: 0.9 mg/dL (ref 0.40–1.20)
GFR: 90.98 mL/min (ref >60.00)
Glucose, Bld: 89 mg/dL (ref 70–99)
Potassium: 4.6 mEq/L (ref 3.5–5.1)
Sodium: 138 mEq/L (ref 135–145)
Total Bilirubin: 0.9 mg/dL (ref 0.2–1.2)
Total Protein: 7.1 g/dL (ref 6.0–8.3)

## 2020-06-20 LAB — LIPID PANEL
Cholesterol: 198 mg/dL (ref 0–200)
HDL: 53.3 mg/dL (ref >39.00)
LDL Cholesterol: 130 mg/dL — ABNORMAL HIGH (ref 0–99)
NonHDL: 144.32
Total CHOL/HDL Ratio: 4
Triglycerides: 70 mg/dL (ref 0.0–149.0)
VLDL: 14 mg/dL (ref 0.0–40.0)

## 2020-06-20 LAB — CBC WITH DIFFERENTIAL/PLATELET
Basophils Absolute: 0 10*3/uL (ref 0.0–0.1)
Basophils Relative: 0.7 % (ref 0.0–3.0)
Eosinophils Absolute: 0.1 10*3/uL (ref 0.0–0.7)
Eosinophils Relative: 2.4 % (ref 0.0–5.0)
HCT: 39.7 % (ref 36.0–46.0)
Hemoglobin: 13.5 g/dL (ref 12.0–15.0)
Lymphocytes Relative: 22.6 % (ref 12.0–46.0)
Lymphs Abs: 1 10*3/uL (ref 0.7–4.0)
MCHC: 34.1 g/dL (ref 30.0–36.0)
MCV: 88.4 fl (ref 78.0–100.0)
Monocytes Absolute: 0.4 10*3/uL (ref 0.1–1.0)
Monocytes Relative: 9.1 % (ref 3.0–12.0)
Neutro Abs: 2.7 10*3/uL (ref 1.4–7.7)
Neutrophils Relative %: 65.2 % (ref 43.0–77.0)
Platelets: 197 10*3/uL (ref 150.0–400.0)
RBC: 4.49 Mil/uL (ref 3.87–5.11)
RDW: 12.9 % (ref 11.5–15.5)
WBC: 4.2 10*3/uL (ref 4.0–10.5)

## 2020-06-20 LAB — TSH: TSH: 1.26 u[IU]/mL (ref 0.35–4.50)

## 2020-06-20 NOTE — Patient Instructions (Signed)
Good to see you today  Decrease your Dr. Reino Kent, increase your water, can drink tea, coffee

## 2020-06-20 NOTE — Telephone Encounter (Signed)
Requested from [facility].

## 2020-06-20 NOTE — Progress Notes (Signed)
Subjective:    Patient ID: Nicole Huff, female    DOB: 1991-07-07, 29 y.o.   MRN: [id number]  HPI Chief Complaint  Patient presents with  . New Patient (Initial Visit)    paps/2020 [facility]. flu/never.. covid/never... td/unsure   This is a 29 yo female who presents today to establish care. 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 Lives with her parents. Enjoys shopping.   [facility] CPE- [facility] Pap- last year Tdap- unknown Flu- not usually Covid 19 vaccine- not vaccinated, has not found time to do this Eye- recent Dental- regular Exercise- not regular Sex- not currently active, declines testing for STDs Sleep- 6-8 hours a night, feels fatigued Menses- regular, not heavy Tob/ Alcohol/ drugs- rare vaping, rare ETOH, no recreational drugs  Dizzy/ nauseous/ presyncope- happened about 5 years ago, now starting back. Will sometimes feel hot.  Prior to last episode, had not had anything to eat or drink. Doesn't eat breakfast. Never has. ? Related to anxiety. Doesn't drink much water. Drinks Dr. Reino Kent 2 cans a day.   Ex- boyfriend says she has bad attitude.  She feels that she is sometimes irritable.  She says that she generally gets along with her parents and does well working with others.  She says that her boyfriend and she argued a lot while they were dating.  Sores inside nose. Doesn't have any now. Did a tele health visit and got cream which takes care of it.  Was told it was impetigo.  She has had 3 episodes in the last year and a half.   Review of Systems Denies chest pain, shortness of breath, abdominal pain, diarrhea, constipation, dysuria, urinary frequency, vaginal itching/discharge.    Objective:   Physical Exam Physical Exam  Constitutional: Oriented to person, place, and time. Appears well-developed and well-nourished. Thin. HENT:  Head: Normocephalic and atraumatic.  Eyes: Conjunctivae are normal.  Neck: Normal range of motion. Neck  supple.  Cardiovascular: Normal rate, regular rhythm and normal heart sounds.   Pulmonary/Chest: Effort normal and breath sounds normal.  Musculoskeletal: No lower extremity edema.   Neurological: Alert and oriented to person, place, and time.  Skin: Skin is warm and dry.  Psychiatric: Normal mood and affect. Behavior is normal. Judgment and thought content normal.  Vitals reviewed.     BP 100/70   Pulse 75   Temp 98.3 F (36.8 C) (Temporal)   Ht 5\' 7"  (1.702 m)   Wt 110 lb 8 oz (50.1 kg)   LMP 06/13/2020 (Exact Date)   SpO2 97%   BMI 17.31 kg/m  Depression screen [facility] 2/9 06/20/2020  Decreased Interest 0  Down, Depressed, Hopeless 0  PHQ - 2 Score 0       Assessment & Plan:  1. Encounter to establish care  - reviewed available EMR and will request gyn records  2. Other fatigue -Discussed importance of regular sleep and stress management - TSH - CBC with Differential - Comprehensive metabolic panel  3. Screening for thyroid disorder - TSH  4. Screening for lipid disorders - Lipid Panel  5. Irritability -Encouraged her to seek counseling but she feels this is interfering with her daily activities  6. COVID-19 virus vaccination not done -Encouraged her to get COVID-19 vaccine as soon as possible  This visit occurred during the SARS-CoV-2 public health emergency.  Safety protocols were in place, including screening questions prior to the visit, additional usage of staff PPE, and extensive cleaning of exam room  while observing appropriate contact time as indicated for disinfecting solutions.     Olean Ree, FNP-BC  [facility], [facility]  06/20/2020 1:56 PM

## 2020-08-04 ENCOUNTER — Encounter: Payer: Self-pay | Admitting: Family Medicine

## 2020-08-30 ENCOUNTER — Telehealth: Payer: BC Managed Care – PPO | Admitting: Family Medicine

## 2020-09-01 NOTE — Telephone Encounter (Signed)
[facility] Night - Client Nonclinical Telephone Record AccessNurse Client [name] Night - Client Client Site [facility] - Night Physician Raechel Ache - MD Contact Type Call Who Is Calling Patient / Member / Family / Caregiver Caller Name Zeanna Sunde Caller Phone Number [phone] Patient Name Nicole Huff Patient DOB 07/19/1991 Call Type Message Only Information Provided Reason for Call Request to [facility] Appointment Initial Comment Caller needs to cancel her virtual appt, 11/6 1020am. Appt time verified. Disp. Time Disposition Final User 08/30/2020 2:10:01 AM General Information Provided Yes Mauk-Olson, Durward Mallard Call Closed By: Bettye Boeck Transaction Date/Time: 08/30/2020 2:08:12 AM (ET)

## 2021-01-26 ENCOUNTER — Ambulatory Visit
Admission: EM | Admit: 2021-01-26 | Discharge: 2021-01-26 | Disposition: A | Discharge status: Home / Self Care | Payer: 59 | Attending: Emergency Medicine | Admitting: Emergency Medicine

## 2021-01-26 ENCOUNTER — Other Ambulatory Visit: Payer: Self-pay

## 2021-01-26 ENCOUNTER — Encounter: Payer: Self-pay | Admitting: Emergency Medicine

## 2021-01-26 ENCOUNTER — Telehealth: Payer: Self-pay

## 2021-01-26 DIAGNOSIS — R1033 Periumbilical pain: Secondary | ICD-10-CM | POA: Diagnosis not present

## 2021-01-26 DIAGNOSIS — Z20822 Contact with and (suspected) exposure to covid-19: Secondary | ICD-10-CM

## 2021-01-26 DIAGNOSIS — R197 Diarrhea, unspecified: Secondary | ICD-10-CM | POA: Diagnosis not present

## 2021-01-26 DIAGNOSIS — R7989 Other specified abnormal findings of blood chemistry: Secondary | ICD-10-CM

## 2021-01-26 MED ORDER — ONDANSETRON 8 MG PO TBDP: 8.0000 mg | ORAL_TABLET | Freq: Three times a day (TID) | ORAL | 0 refills | Status: AC | PRN

## 2021-01-26 NOTE — Telephone Encounter (Signed)
Per chart review tab pt is at [facility] in [place]. Sending note to Dr Para March chief physician as Lorain Childes.

## 2021-01-26 NOTE — ED Triage Notes (Signed)
Pt is present today with abdominal pain, nausea, and diarrhea. Pt states the diarrhea and abdominal pain started Friday and she was using the restroom every 15 mins. Pt states that Saturday is when the nausea started due to lack of appetite. Pt states that she has been able to eat due to constant diarrhea.

## 2021-01-26 NOTE — Telephone Encounter (Signed)
[facility] - Client TELEPHONE ADVICE RECORD AccessNurse Patient Name: Nicole Huff Gender: Female DOB: 01/25/1991 Age: 30 Y 5 M 15 D Return Phone Number: [phone] (Primary) Address: City/ State/ Zip: [place] [address] Client [facility] - Client Client Site [facility] - Day Physician Deboraha Sprang- NP Contact Type Call Who Is Calling Patient / Member / Family / Caregiver Call Type Triage / Clinical Relationship To Patient Self Return Phone Number [phone] (Primary) Chief Complaint Abdominal Pain Reason for Call Symptomatic / Request for Health Information Initial Comment Having abdominal pain Translation No Nurse Assessment Nurse: Self, RN, Heather Date/Time (Eastern Time): 01/26/2021 8:47:24 AM Confirm and document reason for call. If symptomatic, describe symptoms. ---Caller says ABD pain , caller says diarrhea. Caller says nauseated and lightheaded. Does the patient have any new or worsening symptoms? ---Yes Will a triage be completed? ---Yes Related visit to physician within the last 2 weeks? ---No Does the PT have any chronic conditions? (i.e. diabetes, asthma, this includes High risk factors for pregnancy, etc.) ---No Is the patient pregnant or possibly pregnant? (Ask all females between the ages of 29-55) ---No Is this a behavioral health or substance abuse call? ---No Guidelines Guideline Title Affirmed Question Affirmed Notes Nurse Date/Time (Eastern Time) Abdominal Pain - Female [1] MILDMODERATE pain AND [2] constant AND [3] present > 2 hours Self, RN, Herbert Seta 01/26/2021 8:48:39 AM Disp. Time Lamount Cohen Time) Disposition Final User 01/26/2021 8:49:52 AM See HCP within 4 Hours (or PCP triage) Yes Self, RN, Heather PLEASE NOTE: All timestamps contained within this report are represented as [place] Standard Time. CONFIDENTIALTY NOTICE: This fax transmission  is intended only for the addressee. It contains information that is legally privileged, confidential or otherwise protected from use or disclosure. If you are not the intended recipient, you are strictly prohibited from reviewing, disclosing, copying using or disseminating any of this information or taking any action in reliance on or regarding this information. If you have received this fax in error, please notify us immediately by telephone so that we can arrange for its return to [place]. Phone: [phone], Toll-Free: [phone], Fax: [phone] Page: 2 of 3 Call Id: [id number] Caller Disagree/Comply Comply Caller Understands Yes PreDisposition Call Doctor Care Advice Given Per Guideline SEE HCP (OR PCP TRIAGE) WITHIN 4 HOURS: * IF OFFICE WILL BE OPEN: You need to be seen within the next 3 or 4 hours. Call your doctor (or NP/PA) now or as soon as the office opens. CALL BACK IF: * You become worse CARE ADVICE given per Abdominal Pain, Female (Adult) guideline. Comments User: Jenel Lucks, RN Date/Time Lamount Cohen Time): 01/26/2021 8:53:10 AM Backline reports no available appointments today. Caller was instructed to utilize UC Referrals Warm transfer to backline Triage Details User: Self, RN, Heather Date/Time: 01/26/2021 8:48:39 AM Triage Guideline: Abdominal Pain - Female Shock suspected (e.g., cold/pale/clammy skin, too weak to stand, low BP, rapid pulse) -----No Difficult to awaken or acting confused (e.g., disoriented, slurred speech) -----No Passed out (i.e., lost consciousness, collapsed and was not responding) -----No Sounds like a life-threatening emergency to the triager -----No Chest pain -----No Pain is mainly in upper abdomen (if needed ask: "is it mainly above the belly button?") -----No Followed an abdomen (stomach) injury -----No [1] Abdominal pain AND [2] pregnant < 20 weeks -----No [1] Abdominal pain AND [2] pregnant 20 or more weeks PLEASE NOTE: All timestamps  contained within this report are represented as [place] Standard  Time. CONFIDENTIALTY NOTICE: This fax transmission is intended only for the addressee. It contains information that is legally privileged, confidential or otherwise protected from use or disclosure. If you are not the intended recipient, you are strictly prohibited from reviewing, disclosing, copying using or disseminating any of this information or taking any action in reliance on or regarding this information. If you have received this fax in error, please notify us immediately by telephone so that we can arrange for its return to [place]. Phone: [phone], Toll-Free: [phone], Fax: [phone] Page: 3 of 3 Call Id: [id number] Triage Details -----No [1] Abdominal pain AND [2] postpartum (from 0 to 6 weeks after delivery) -----No [1] SEVERE pain (e.g., excruciating) AND [2] present > 1 hour -----No [1] SEVERE pain AND [2] age > 60 years -----No [1] Vomiting AND [2] contains red blood or black ("coffee ground") material (Exception: few red streaks in vomit that only happened once) -----No Blood in bowel movements (Exception: blood on surface of BM with constipation) -----No Black or tarry bowel movements (Exception: chronic-unchanged black-grey bowel movements AND is taking iron pills or Pepto-bismol) -----No Patient sounds very sick or weak to the triager -----No [1] MILD-MODERATE pain AND [2] constant AND [3] present > 2 hours -----Yes Disposition: See HCP within 4 Hours (or PCP triage) SEE HCP (OR PCP TRIAGE) WITHIN 4 HOURS: * IF OFFICE WILL BE OPEN: You need to be seen within the next 3 or 4 hours. Call your doctor (or NP/PA) now or as soon as the office opens. CALL BACK IF: * You become worse CARE ADVICE given per Abdominal Pain, Female (Adult) guideline

## 2021-01-26 NOTE — Telephone Encounter (Signed)
[facility] Night - Client TELEPHONE ADVICE RECORD AccessNurse Patient Name: Nicole Huff Gender: Female DOB: [birth year] Age: 30 Y 5 M 15 D Return Phone Number: [phone] (Primary) Address: City/ State/ Zip: [place] [address] Client [facility] Night - Client Client Site [facility] - Night Contact Type Call Who Is Calling Patient / Member / Family / Caregiver Call Type Triage / Clinical Relationship To Patient Self Return Phone Number [phone] (Primary) Chief Complaint Nausea Reason for Call Request to Schedule Office Appointment Initial Comment Caller's had diarrhea over the weekend, and is nauseated. She is wanting to be seen today. Translation No No Triage Reason Other Nurse Assessment Nurse: Doylene Canard, RN, Lesa Date/Time Lamount Cohen Time): 01/26/2021 8:56:42 AM Confirm and document reason for call. If symptomatic, describe symptoms. ---Caller states she just spoke with a nurse and has been advised to go to UC. She states she no longer needs a nurse. She is nauseous Does the patient have any new or worsening symptoms? ---Yes Will a triage be completed? ---No Select reason for no triage. ---Other Disp. Time Lamount Cohen Time) Disposition Final User 01/26/2021 9:00:09 AM Clinical Call Yes Conner, RN, Lesa Triage Details

## 2021-01-26 NOTE — Discharge Instructions (Addendum)
Transient Imodium in addition to the Zofran.  Push electrolyte containing fluids such as Pedialyte until your urine is clear.  Take 400 mg of ibuprofen combined with 500 mg of Tylenol together 3-4 times a day as needed for pain.  COVID test will be back in 2 to 3 days.

## 2021-01-26 NOTE — ED Provider Notes (Signed)
HPI  SUBJECTIVE:  Nicole Huff is a 30 y.o. female who presents with nausea, dull, achy, periumbilical pain for the past 3 days.  It does not migrate, radiate, is intermittent, lasting hours.  She states that she feels gassy and states that she feels as if her stomach is "constantly moving".  She has had 4 days of watery, nonbloody diarrhea.  She states that it was over 30 times on Friday, but has been steadily decreasing.  She reports 2 episodes of greasy, oily diarrhea yesterday after eating.  She states that when she eats," it goes right through me"..  She has tried rest, fasting, bland foods.  Fasting helps.  Symptoms worse with eating, walking.  No raw undercooked foods, questionable leftovers, recent travel, contacts with diarrheal illness, recent antibiotics.  No anorexia, fevers, abdominal distention, vomiting, change in urine output, back, pelvic pain, vaginal complaints.  No body aches, headaches, nasal congestion, rhinorrhea, sore throat, loss of sense of smell or taste, cough, shortness of breath.  No known Covid exposure.  She did not get the COVID vaccine.  Past medical history negative for diabetes, hypertension, abdominal surgeries, pancreatitis, gallbladder disease, mesenteric ischemia, atrial fibrillation, hypercoagulability, C. difficile diarrhea.  She denies excess NSAID or alcohol use.  LMP: Yesterday.  Denies the possibility being pregnant.  PMD: [employer].    Past Medical History:  Diagnosis Date  . Medical history non-contributory     Past Surgical History:  Procedure Laterality Date  . NO PAST SURGERIES      Family History  Problem Relation Age of Onset  . Thyroid disease Mother   . Thyroid disease Maternal Grandmother   . Diabetes Maternal Grandfather     Social History   Tobacco Use  . Smoking status: Never Smoker  . Smokeless tobacco: Never Used  Substance Use Topics  . Alcohol use: Yes    Comment: rarely  . Drug use: No    No current  facility-administered medications for this encounter.  Current Outpatient Medications:  .  ondansetron (ZOFRAN ODT) 8 MG disintegrating tablet, Take 1 tablet (8 mg total) by mouth every 8 (eight) hours as needed for up to 7 days for nausea or vomiting. Take 1/2 to 1 tablet 3 times a day, Disp: 20 tablet, Rfl: 0 .  OVER THE COUNTER MEDICATION, Hers acne--tretinoin, clindamycin, azelaic acid, zinc pyrithione, and niacinamide, Disp: , Rfl:   No Known Allergies   ROS  As noted in HPI.   Physical Exam  BP 131/87 (BP Location: Left Arm)   Pulse 64   Temp (!) 97.5 F (36.4 C) (Oral)   Resp 16   LMP 01/25/2021   SpO2 99%   Constitutional: Well developed, well nourished, no acute distress Eyes:  EOMI, conjunctiva normal bilaterally HENT: Normocephalic, atraumatic,mucus membranes moist Respiratory: Normal inspiratory effort Cardiovascular: Normal rate, regular rhythm, no murmurs rubs or gallops. GI: nondistended flat, soft, mild periumbilical tenderness.  No rebound, guarding.  Active bowel sounds. skin: Cap refill 2 to 3 seconds.  No rash, skin intact Musculoskeletal: no deformities Neurologic: Alert & oriented x 3, no focal neuro deficits Psychiatric: Speech and behavior appropriate   ED Course   Medications - No data to display  Orders Placed This Encounter  Procedures  . Novel Coronavirus, NAA ([facility])    Standing Status:   Standing    Number of Occurrences:   1    Order Specific Question:   Release to patient    Answer:   Immediate  No results found for this or any previous visit (from the past 24 hour(s)). No results found.  ED Clinical Impression  1. Diarrhea of presumed infectious origin   2. Periumbilical abdominal pain   3. Encounter for laboratory testing for COVID-19 virus      ED Assessment/Plan  Patient with diarrhea, most likely infectious.  She is slightly dehydrated, but I do not think that she needs IV fluids.  Will send off COVID test.  Her  abdomen is otherwise benign. Pt abd exam is benign, no peritoneal signs.  Pancreatitis in the differential, but patient appears healthy, has normal vital signs.  No evidence of surgical abd. labs not available at this facility today. doubt SBO, mesenteric ischemia, appendicitis, hepatitis, cholecystitis,  or perforated viscus.  Will attempt oral rehydration, Imodium, Zofran, Tylenol/ibuprofen together 3-4 times a day, strict ER return precautions given.  Work note till [employer] is resulted.  Discussed  MDM, treatment plan, and plan for follow-up with patient. Discussed sn/sx that should prompt return to the ED. patient agrees with plan.   Meds ordered this encounter  Medications  . ondansetron (ZOFRAN ODT) 8 MG disintegrating tablet    Sig: Take 1 tablet (8 mg total) by mouth every 8 (eight) hours as needed for up to 7 days for nausea or vomiting. Take 1/2 to 1 tablet 3 times a day    Dispense:  20 tablet    Refill:  0      *This clinic note was created using Scientist, clinical (histocompatibility and immunogenetics). Therefore, there may be occasional mistakes despite careful proofreading.  ?    Domenick Gong, MD 01/27/21 [phone]

## 2021-01-26 NOTE — Telephone Encounter (Signed)
Noted.  Will await UC notes.  Thanks.  

## 2021-01-26 NOTE — Telephone Encounter (Signed)
See note under access nurse note;per chart review tab pt is already at [facility] on [address].

## 2021-01-27 ENCOUNTER — Emergency Department (HOSPITAL_COMMUNITY)
Admission: EM | Admit: 2021-01-27 | Discharge: 2021-01-27 | Disposition: A | Discharge status: Home / Self Care | Payer: 59 | Attending: Emergency Medicine | Admitting: Emergency Medicine

## 2021-01-27 ENCOUNTER — Encounter (HOSPITAL_COMMUNITY): Payer: Self-pay

## 2021-01-27 ENCOUNTER — Other Ambulatory Visit: Payer: Self-pay

## 2021-01-27 DIAGNOSIS — R1013 Epigastric pain: Secondary | ICD-10-CM | POA: Insufficient documentation

## 2021-01-27 DIAGNOSIS — R112 Nausea with vomiting, unspecified: Secondary | ICD-10-CM | POA: Diagnosis present

## 2021-01-27 DIAGNOSIS — R197 Diarrhea, unspecified: Secondary | ICD-10-CM | POA: Insufficient documentation

## 2021-01-27 LAB — I-STAT BETA HCG BLOOD, ED (MC, WL, AP ONLY): I-stat hCG, quantitative: <5 m[IU]/mL (ref <5)

## 2021-01-27 LAB — CBC WITH DIFFERENTIAL/PLATELET
Abs Immature Granulocytes: 0 10*3/uL (ref 0.00–0.07)
Basophils Absolute: 0 10*3/uL (ref 0.0–0.1)
Basophils Relative: 0 %
Eosinophils Absolute: 0 10*3/uL (ref 0.0–0.5)
Eosinophils Relative: 1 %
HCT: 41.3 % (ref 36.0–46.0)
Hemoglobin: 14.2 g/dL (ref 12.0–15.0)
Immature Granulocytes: 0 %
Lymphocytes Relative: 23 %
Lymphs Abs: 0.7 10*3/uL (ref 0.7–4.0)
MCH: 29.8 pg (ref 26.0–34.0)
MCHC: 34.4 g/dL (ref 30.0–36.0)
MCV: 86.6 fL (ref 80.0–100.0)
Monocytes Absolute: 0.5 10*3/uL (ref 0.1–1.0)
Monocytes Relative: 18 %
Neutro Abs: 1.6 10*3/uL — ABNORMAL LOW (ref 1.7–7.7)
Neutrophils Relative %: 58 %
Platelets: 173 10*3/uL (ref 150–400)
RBC: 4.77 MIL/uL (ref 3.87–5.11)
RDW: 11.9 % (ref 11.5–15.5)
WBC: 2.8 10*3/uL — ABNORMAL LOW (ref 4.0–10.5)
nRBC: 0 % (ref 0.0–0.2)

## 2021-01-27 LAB — COMPREHENSIVE METABOLIC PANEL
ALT: 18 U/L (ref 0–44)
AST: 24 U/L (ref 15–41)
Albumin: 4.2 g/dL (ref 3.5–5.0)
Alkaline Phosphatase: 50 U/L (ref 38–126)
Anion gap: 7 (ref 5–15)
BUN: 10 mg/dL (ref 6–20)
CO2: 29 mmol/L (ref 22–32)
Calcium: 8.8 mg/dL — ABNORMAL LOW (ref 8.9–10.3)
Chloride: 104 mmol/L (ref 98–111)
Creatinine, Ser: 0.72 mg/dL (ref 0.44–1.00)
GFR, Estimated: >60 mL/min (ref >60)
Glucose, Bld: 91 mg/dL (ref 70–99)
Potassium: 3.6 mmol/L (ref 3.5–5.1)
Sodium: 140 mmol/L (ref 135–145)
Total Bilirubin: 0.7 mg/dL (ref 0.3–1.2)
Total Protein: 7 g/dL (ref 6.5–8.1)

## 2021-01-27 LAB — LIPASE, BLOOD: Lipase: 27 U/L (ref 11–51)

## 2021-01-27 MED ORDER — LIDOCAINE VISCOUS HCL 2 % MT SOLN
15.0000 mL | Freq: Once | OROMUCOSAL | Status: AC
  Administered 2021-01-27: 15 mL via ORAL
  Filled 2021-01-27: qty 15

## 2021-01-27 MED ORDER — PANTOPRAZOLE SODIUM 40 MG PO TBEC: 40.0000 mg | DELAYED_RELEASE_TABLET | Freq: Every day | ORAL | 0 refills | Status: DC

## 2021-01-27 MED ORDER — ALUM & MAG HYDROXIDE-SIMETH 200-200-20 MG/5ML PO SUSP
30.0000 mL | Freq: Once | ORAL | Status: AC
  Administered 2021-01-27: 30 mL via ORAL
  Filled 2021-01-27: qty 30

## 2021-01-27 MED ORDER — DICYCLOMINE HCL 20 MG PO TABS
20.0000 mg | ORAL_TABLET | Freq: Once | ORAL | Status: AC
  Administered 2021-01-27: 20 mg via ORAL
  Filled 2021-01-27: qty 1

## 2021-01-27 MED ORDER — PROMETHAZINE HCL 25 MG PO TABS: 25.0000 mg | ORAL_TABLET | Freq: Three times a day (TID) | ORAL | 0 refills | Status: DC | PRN

## 2021-01-27 MED ORDER — DICYCLOMINE HCL 20 MG PO TABS: 20.0000 mg | ORAL_TABLET | Freq: Three times a day (TID) | ORAL | 0 refills | Status: DC | PRN

## 2021-01-27 MED ORDER — SODIUM CHLORIDE 0.9 % IV SOLN
12.5000 mg | Freq: Once | INTRAVENOUS | Status: AC
  Administered 2021-01-27: 12.5 mg via INTRAVENOUS
  Filled 2021-01-27: qty 0.5

## 2021-01-27 MED ORDER — SODIUM CHLORIDE 0.9 % IV BOLUS
1000.0000 mL | Freq: Once | INTRAVENOUS | Status: AC
Start: 2021-01-27 — End: 2021-01-27
  Administered 2021-01-27: 1000 mL via INTRAVENOUS

## 2021-01-27 MED ORDER — PANTOPRAZOLE SODIUM 40 MG IV SOLR
40.0000 mg | Freq: Once | INTRAVENOUS | Status: AC
  Administered 2021-01-27: 40 mg via INTRAVENOUS
  Filled 2021-01-27: qty 40

## 2021-01-27 MED ORDER — SUCRALFATE 1 GM/10ML PO SUSP: 1.0000 g | Freq: Three times a day (TID) | ORAL | 0 refills | Status: DC

## 2021-01-27 NOTE — ED Provider Notes (Signed)
MSE was initiated and I personally evaluated the patient and placed orders (if any) at  2:27 AM on January 27, 2021.  Patient presents with complaints of epigastric pain w/ N/V/D x 4 days.  Zofran given by UC- no further emesis, persistent nausea, diarrhea, & discomfort. Diarrhea frequency improved. No recent foreign travel or abx.   Epigastric tenderness to palpation, no peritoneal signs.   The patient appears stable so that the remainder of the MSE may be completed by another provider.   Nicole Huff 01/27/21 0229    Dione Booze, MD 01/27/21 [phone]

## 2021-01-27 NOTE — Telephone Encounter (Signed)
Spoke with patient and advised on below message. Patient verbalized understanding and will call back to schedule a lab appointment once she is feeling better.

## 2021-01-27 NOTE — ED Notes (Signed)
Pt ambulatory to bathroom for urine sample.

## 2021-01-27 NOTE — Telephone Encounter (Signed)
Please check on patient.  Update me as needed.  She had mildly low WBC.  That can happen in the setting of an acute illness.  Reasonable to recheck once she is feeling better.  I put in the f/u CBC order.  Wouldn't collect until she has felt well for a few days.  Thanks.

## 2021-01-27 NOTE — Addendum Note (Signed)
Addended by: Joaquim Nam on: 01/27/2021 07:54 AM   Modules accepted: Orders

## 2021-01-27 NOTE — ED Provider Notes (Signed)
[facility]-EMERGENCY DEPT Provider Note   CSN: [id number] Arrival date & time: 01/27/21  0128     History Chief Complaint  Patient presents with  . Abdominal Pain    Nicole Huff is a 30 y.o. female without significant past medical hx who presents to the ED with complaints of N/V/D & abdominal pain x 4 days. Patient reports pain is located to the epigastrium, feels like cramping/aching, constant, no alleviating/aggravating factors. Having associated nausea & emesis, since starting zofran by UC has not had additional emesis but nausea persists. Also reports diarrhea overall improving in frequency, 20 episodes on day 1, decreasing since, 3 episodes today.  No other alleviating or aggravating factors.  She denies fever, hematemesis, hematochezia, dysuria, frequency, urgency, syncope, chest pain, or shortness of breath.  She denies recent foreign travel or antibiotics.   HPI     Past Medical History:  Diagnosis Date  . Medical history non-contributory     There are no problems to display for this patient.   Past Surgical History:  Procedure Laterality Date  . NO PAST SURGERIES       OB History    Gravida  0   Para  0   Term  0   Preterm  0   AB  0   Living  0     SAB  0   IAB  0   Ectopic  0   Multiple  0   Live Births              Family History  Problem Relation Age of Onset  . Thyroid disease Mother   . Thyroid disease Maternal Grandmother   . Diabetes Maternal Grandfather     Social History   Tobacco Use  . Smoking status: Never Smoker  . Smokeless tobacco: Never Used  Substance Use Topics  . Alcohol use: Yes    Comment: rarely  . Drug use: No    Home Medications Prior to Admission medications   Medication Sig Start Date End Date Taking? Authorizing Provider  ondansetron (ZOFRAN ODT) 8 MG disintegrating tablet Take 1 tablet (8 mg total) by mouth every 8 (eight) hours as needed for up to 7 days for nausea or  vomiting. Take 1/2 to 1 tablet 3 times a day 01/26/21 02/02/21 Yes Domenick Gong, MD  OVER THE COUNTER MEDICATION Hers acne--tretinoin, clindamycin, azelaic acid, zinc pyrithione, and niacinamide Patient not taking: Reported on 01/27/2021    [provider]    Allergies    Patient has no known allergies.  Review of Systems   Review of Systems  Constitutional: Negative for chills and fever.  Respiratory: Negative for shortness of breath.   Cardiovascular: Negative for chest pain.  Gastrointestinal: Positive for abdominal pain, diarrhea, nausea and vomiting. Negative for anal bleeding, blood in stool and constipation.  Genitourinary: Negative for dysuria, frequency, urgency, vaginal bleeding and vaginal discharge.  Neurological: Negative for syncope.  All other systems reviewed and are negative.   Physical Exam Updated Vital Signs BP 105/78   Pulse 67   Temp 98.3 F (36.8 C) (Oral)   Resp 16   Ht 5\' 7"  (1.702 m)   Wt 52.2 kg   LMP 01/25/2021   SpO2 100%   BMI 18.01 kg/m   Physical Exam Vitals and nursing note reviewed.  Constitutional:      General: She is not in acute distress.    Appearance: She is well-developed. She is not toxic-appearing.  HENT:  Head: Normocephalic and atraumatic.  Eyes:     General:        Right eye: No discharge.        Left eye: No discharge.     Conjunctiva/sclera: Conjunctivae normal.  Cardiovascular:     Rate and Rhythm: Normal rate and regular rhythm.  Pulmonary:     Effort: Pulmonary effort is normal. No respiratory distress.     Breath sounds: Normal breath sounds. No wheezing, rhonchi or rales.  Abdominal:     General: There is no distension.     Palpations: Abdomen is soft.     Tenderness: There is abdominal tenderness (mild) in the epigastric area. There is no guarding or rebound. Negative signs include Murphy's sign.  Musculoskeletal:     Cervical back: Neck supple.  Skin:    General: Skin is warm and dry.      Findings: No rash.  Neurological:     Mental Status: She is alert.     Comments: Clear speech.   Psychiatric:        Behavior: Behavior normal.     ED Results / Procedures / Treatments   Labs (all labs ordered are listed, but only abnormal results are displayed) Labs Reviewed  CBC WITH DIFFERENTIAL/PLATELET - Abnormal; Notable for the following components:      Result Value   WBC 2.8 (*)    Neutro Abs 1.6 (*)    All other components within normal limits  COMPREHENSIVE METABOLIC PANEL  LIPASE, BLOOD  URINALYSIS, ROUTINE W REFLEX MICROSCOPIC  PREGNANCY, URINE    EKG None  Radiology No results found.  Procedures Procedures   Medications Ordered in ED Medications  sodium chloride 0.9 % bolus 1,000 mL (has no administration in time range)  promethazine (PHENERGAN) 12.5 mg in sodium chloride 0.9 % 50 mL IVPB (has no administration in time range)  pantoprazole (PROTONIX) injection 40 mg (has no administration in time range)  alum & mag hydroxide-simeth (MAALOX/MYLANTA) 200-200-20 MG/5ML suspension 30 mL (has no administration in time range)    And  lidocaine (XYLOCAINE) 2 % viscous mouth solution 15 mL (has no administration in time range)    ED Course  I have reviewed the triage vital signs and the nursing notes.  Pertinent labs & imaging results that were available during my care of the patient were reviewed by me and considered in my medical decision making (see chart for details).    MDM Rules/Calculators/A&P                         Patient presents to the ED with complaints of abdominal pain. Patient nontoxic appearing, in no apparent distress, vitals WNL. On exam patient tender to epigastrium, no peritoneal signs.  Additional history obtained:  Additional history obtained from chart review & nursing note review.   Lab Tests:  I Ordered, reviewed, and interpreted labs, which included:  CBC: Mild leukopenia- will need PCP recheck.  CMP: Mild hypocalcemia,  otherwise unremarkable.  Lipase: WNL Preg test: Negative- not resulting in chart.   ED Course:  Patient given 1L NS, phenergan, protonix, and GI cocktail.   07:00: RE-EVAL: Patient feeling improved, tolerating PO.   On repeat abdominal exam patient remains without peritoneal signs, low suspicion for cholecystitis, pancreatitis, diverticulitis, appendicitis, bowel obstruction/perforation,  ectopic pregnancy, or other acute surgical process. Patient tolerating PO in the emergency department. Overall suspect this could be viral. No recent foreign travel or abx. Will discharge home with  supportive measures. I discussed results, treatment plan, need for PCP follow-up, and return precautions with the patient. Provided opportunity for questions, patient confirmed understanding and is in agreement with plan.    Portions of this note were generated with Scientist, clinical (histocompatibility and immunogenetics). Dictation errors may occur despite best attempts at proofreading.   Final Clinical Impression(s) / ED Diagnoses Final diagnoses:  Nausea vomiting and diarrhea  Epigastric pain    Rx / DC Orders ED Discharge Orders         Ordered    promethazine (PHENERGAN) 25 MG tablet  Every 8 hours PRN        01/27/21 0706    dicyclomine (BENTYL) 20 MG tablet  Every 8 hours PRN        01/27/21 0706    sucralfate (CARAFATE) 1 GM/10ML suspension  3 times daily with meals & bedtime        01/27/21 0706    pantoprazole (PROTONIX) 40 MG tablet  Daily        01/27/21 0706           Cherly Anderson, PA-C 01/27/21 0710    Dione Booze, MD 01/27/21 [phone]

## 2021-01-27 NOTE — ED Triage Notes (Signed)
Pt reports nausea, diarrhea and upper abdominal pain. Pt seen at [facility] yesterday and received Rx of zofran with no improvement.

## 2021-01-27 NOTE — Discharge Instructions (Addendum)
You are seen in the emergency department today for abdominal pain with nausea, vomiting, and diarrhea.  Your labs are overall reassuring, your white blood cell count was mildly low, please be sure to have this rechecked by your primary care provider in 1 to 2 weeks. We are sending you home with the following medications to help with your symptoms:  - Protonix- please take 1 tablet in the morning prior to any meals to help with stomach acidity/pain.  - Carafate- please take prior to each meal and prior to bedtime to help with stomach acidity/pain.  -Phenergan- please take every 8 hours as needed for nausea/vomiting.  -Bentyl-please take this every 8 hours as needed for abdominal cramping.  We have prescribed you new medication(s) today. Discuss the medications prescribed today with your pharmacist as they can have adverse effects and interactions with your other medicines including over the counter and prescribed medications. Seek medical evaluation if you start to experience new or abnormal symptoms after taking one of these medicines, seek care immediately if you start to experience difficulty breathing, feeling of your throat closing, facial swelling, or rash as these could be indications of a more serious allergic reaction  Please follow attached diet guidelines.   Follow up with your primary care provider within 3 days for re-evaluation.  Return to the ER for new or worsening symptoms including but not limited to worsened pain, new pain, inability to keep fluids down, blood in vomit/stool, passing out, or any other concerns.

## 2021-01-28 LAB — NOVEL CORONAVIRUS, NAA: SARS-CoV-2, NAA: NOT DETECTED

## 2021-01-28 LAB — SARS-COV-2, NAA 2 DAY TAT

## 2021-02-04 ENCOUNTER — Telehealth: Payer: Self-pay | Admitting: Family Medicine

## 2021-02-04 NOTE — Telephone Encounter (Signed)
Pt called in she is scheduled for hospital follow up w/ Jae Dire on 4/22 @ 1120 and the hospital faxed over paperwork of labs she is needing.  Thank you

## 2021-02-05 NOTE — Telephone Encounter (Signed)
Note made in app

## 2021-02-13 ENCOUNTER — Ambulatory Visit: Payer: 59 | Admitting: Primary Care

## 2021-02-13 ENCOUNTER — Other Ambulatory Visit: Payer: Self-pay

## 2021-02-13 ENCOUNTER — Encounter: Payer: Self-pay | Admitting: Primary Care

## 2021-02-13 VITALS — BP 98/74 | HR 82 | Temp 98.6°F | Ht 67.0 in | Wt 110.0 lb

## 2021-02-13 DIAGNOSIS — R112 Nausea with vomiting, unspecified: Secondary | ICD-10-CM

## 2021-02-13 DIAGNOSIS — Z1159 Encounter for screening for other viral diseases: Secondary | ICD-10-CM

## 2021-02-13 DIAGNOSIS — Z114 Encounter for screening for human immunodeficiency virus [HIV]: Secondary | ICD-10-CM | POA: Diagnosis not present

## 2021-02-13 DIAGNOSIS — R7989 Other specified abnormal findings of blood chemistry: Secondary | ICD-10-CM | POA: Diagnosis not present

## 2021-02-13 DIAGNOSIS — R197 Diarrhea, unspecified: Secondary | ICD-10-CM

## 2021-02-13 HISTORY — DX: Nausea with vomiting, unspecified: R11.2

## 2021-02-13 LAB — CBC WITH DIFFERENTIAL/PLATELET
Basophils Absolute: 0 10*3/uL (ref 0.0–0.1)
Basophils Relative: 0.4 % (ref 0.0–3.0)
Eosinophils Absolute: 0.2 10*3/uL (ref 0.0–0.7)
Eosinophils Relative: 4.2 % (ref 0.0–5.0)
HCT: 39.1 % (ref 36.0–46.0)
Hemoglobin: 13.3 g/dL (ref 12.0–15.0)
Lymphocytes Relative: 16.5 % (ref 12.0–46.0)
Lymphs Abs: 0.8 10*3/uL (ref 0.7–4.0)
MCHC: 34 g/dL (ref 30.0–36.0)
MCV: 85.8 fl (ref 78.0–100.0)
Monocytes Absolute: 0.4 10*3/uL (ref 0.1–1.0)
Monocytes Relative: 7.5 % (ref 3.0–12.0)
Neutro Abs: 3.5 10*3/uL (ref 1.4–7.7)
Neutrophils Relative %: 71.4 % (ref 43.0–77.0)
Platelets: 209 10*3/uL (ref 150.0–400.0)
RBC: 4.56 Mil/uL (ref 3.87–5.11)
RDW: 13 % (ref 11.5–15.5)
WBC: 4.9 10*3/uL (ref 4.0–10.5)

## 2021-02-13 NOTE — Assessment & Plan Note (Signed)
Acute episode in early April 2022, evaluated and treated in the ED.   Overall doing better now. Appears well. Continue pantoprazole 40 mg daily and Carafate PRN.   Repeat CBC pending.  Return precautions provided. ED notes, labs, imaging reviewed.

## 2021-02-13 NOTE — Progress Notes (Signed)
Subjective:    Patient ID: Nicole Huff, female    DOB: Nov 17, 1990, 30 y.o.   MRN: [id number]  HPI  Nicole Huff is a very pleasant 30 y.o. female patient of Deboraha Sprang who presents today for ED follow up.  She presented to [facility] on 01/27/21 with a four day history of epigastric abdominal pain with nausea, vomiting, and diarrhea.   She tested negative for Covid-19. Labs with lower WBC count, but otherwise negative for pancreatitis, gall bladder attack. Exam was reassuring and negative for acute appendicitis.   She was discharged home later that day with prescriptions for promethazine 25 mg, dicyclomine 20 mg, Carafate suspension, pantoprazole 40 mg.   Today she endorses improvement with pantoprazole 40 mg and Carafate for which she takes daily. She denies nausea, vomiting, diarrhea. She's noticed a decrease in appetite since her ED visit, but is able to eat and drink ok. She does have intermittent "cold sensation" to her epigastric region.    Review of Systems  Constitutional: Positive for appetite change. Negative for fever.  Gastrointestinal: Negative for abdominal pain, diarrhea, nausea and vomiting.         Past Medical History:  Diagnosis Date  . Medical history non-contributory     Social History   Socioeconomic History  . Marital status: Single    Spouse name: Not on file  . Number of children: Not on file  . Years of education: Not on file  . Highest education level: Not on file  Occupational History  . Not on file  Tobacco Use  . Smoking status: Never Smoker  . Smokeless tobacco: Never Used  Substance and Sexual Activity  . Alcohol use: Yes    Comment: rarely  . Drug use: No  . Sexual activity: Yes    Birth control/protection: Implant, Pill  Other Topics Concern  . Not on file  Social History Narrative  . Not on file   Social Determinants of Health   Financial Resource Strain: Not on file  Food Insecurity: Not on file  Transportation  Needs: Not on file  Physical Activity: Not on file  Stress: Not on file  Social Connections: Not on file  Intimate Partner Violence: Not on file    Past Surgical History:  Procedure Laterality Date  . NO PAST SURGERIES      Family History  Problem Relation Age of Onset  . Thyroid disease Mother   . Thyroid disease Maternal Grandmother   . Diabetes Maternal Grandfather     No Known Allergies  Current Outpatient Medications on File Prior to Visit  Medication Sig Dispense Refill  . OVER THE COUNTER MEDICATION Hers acne--tretinoin, clindamycin, azelaic acid, zinc pyrithione, and niacinamide    . pantoprazole (PROTONIX) 40 MG tablet Take 1 tablet (40 mg total) by mouth daily. 30 tablet 0  . promethazine (PHENERGAN) 25 MG tablet Take 1 tablet (25 mg total) by mouth every 8 (eight) hours as needed for nausea or vomiting. 10 tablet 0  . sucralfate (CARAFATE) 1 GM/10ML suspension Take 10 mLs (1 g total) by mouth 4 (four) times daily -  with meals and at bedtime. 420 mL 0  . dicyclomine (BENTYL) 20 MG tablet Take 1 tablet (20 mg total) by mouth every 8 (eight) hours as needed for spasms. (Patient not taking: Reported on 02/13/2021) 30 tablet 0   No current facility-administered medications on file prior to visit.    BP 98/74   Pulse 82   Temp 98.6 F (  37 C) (Temporal)   Ht 5\' 7"  (1.702 m)   Wt 110 lb (49.9 kg)   LMP 01/25/2021   SpO2 96%   BMI 17.23 kg/m  Objective:   Physical Exam Cardiovascular:     Rate and Rhythm: Normal rate and regular rhythm.  Pulmonary:     Effort: Pulmonary effort is normal.  Abdominal:     General: Abdomen is flat.     Palpations: Abdomen is soft.     Tenderness: There is no abdominal tenderness.  Musculoskeletal:     Cervical back: Neck supple.  Skin:    General: Skin is warm and dry.           Assessment & Plan:      This visit occurred during the SARS-CoV-2 public health emergency.  Safety protocols were in place, including  screening questions prior to the visit, additional usage of staff PPE, and extensive cleaning of exam room while observing appropriate contact time as indicated for disinfecting solutions.

## 2021-02-13 NOTE — Patient Instructions (Signed)
Stop by the lab prior to leaving today. I will notify you of your results once received.   Finish out the pantoprazole 40 mg daily for your stomach symptoms.  Use the Carafate suspension if needed.  The dicyclomine can be used as needed for stomach cramping/pain.  It was a pleasure to see you today!

## 2021-02-16 ENCOUNTER — Ambulatory Visit (HOSPITAL_COMMUNITY)
Admission: RE | Admit: 2021-02-16 | Discharge: 2021-02-16 | Disposition: A | Discharge status: Home / Self Care | Payer: 59 | Attending: Psychiatry | Admitting: Psychiatry

## 2021-02-16 ENCOUNTER — Other Ambulatory Visit: Payer: Self-pay

## 2021-02-16 ENCOUNTER — Emergency Department (HOSPITAL_COMMUNITY): Payer: 59

## 2021-02-16 ENCOUNTER — Emergency Department (HOSPITAL_COMMUNITY)
Admission: EM | Admit: 2021-02-16 | Discharge: 2021-02-16 | Disposition: A | Discharge status: Home / Self Care | Payer: 59 | Attending: Emergency Medicine | Admitting: Emergency Medicine

## 2021-02-16 ENCOUNTER — Encounter (HOSPITAL_COMMUNITY): Payer: Self-pay

## 2021-02-16 DIAGNOSIS — R1084 Generalized abdominal pain: Secondary | ICD-10-CM | POA: Diagnosis not present

## 2021-02-16 DIAGNOSIS — R197 Diarrhea, unspecified: Secondary | ICD-10-CM

## 2021-02-16 DIAGNOSIS — R112 Nausea with vomiting, unspecified: Secondary | ICD-10-CM | POA: Insufficient documentation

## 2021-02-16 LAB — URINALYSIS, ROUTINE W REFLEX MICROSCOPIC
Bilirubin Urine: NEGATIVE
Glucose, UA: NEGATIVE mg/dL
Hgb urine dipstick: NEGATIVE
Ketones, ur: 20 mg/dL — AB
Leukocytes,Ua: NEGATIVE
Nitrite: NEGATIVE
Protein, ur: 30 mg/dL — AB
Specific Gravity, Urine: 1.024 (ref 1.005–1.030)
pH: 5 (ref 5.0–8.0)

## 2021-02-16 LAB — CBC WITH DIFFERENTIAL/PLATELET
Abs Immature Granulocytes: 0.03 10*3/uL (ref 0.00–0.07)
Basophils Absolute: 0 10*3/uL (ref 0.0–0.1)
Basophils Relative: 0 %
Eosinophils Absolute: 0.1 10*3/uL (ref 0.0–0.5)
Eosinophils Relative: 1 %
HCT: 44 % (ref 36.0–46.0)
Hemoglobin: 14.9 g/dL (ref 12.0–15.0)
Immature Granulocytes: 0 %
Lymphocytes Relative: 7 %
Lymphs Abs: 0.6 10*3/uL — ABNORMAL LOW (ref 0.7–4.0)
MCH: 29.5 pg (ref 26.0–34.0)
MCHC: 33.9 g/dL (ref 30.0–36.0)
MCV: 87.1 fL (ref 80.0–100.0)
Monocytes Absolute: 0.7 10*3/uL (ref 0.1–1.0)
Monocytes Relative: 8 %
Neutro Abs: 7.6 10*3/uL (ref 1.7–7.7)
Neutrophils Relative %: 84 %
Platelets: 240 10*3/uL (ref 150–400)
RBC: 5.05 MIL/uL (ref 3.87–5.11)
RDW: 11.9 % (ref 11.5–15.5)
WBC: 9 10*3/uL (ref 4.0–10.5)
nRBC: 0 % (ref 0.0–0.2)

## 2021-02-16 LAB — I-STAT CHEM 8, ED
BUN: 11 mg/dL (ref 6–20)
Calcium, Ion: 1.23 mmol/L (ref 1.15–1.40)
Chloride: 104 mmol/L (ref 98–111)
Creatinine, Ser: 0.6 mg/dL (ref 0.44–1.00)
Glucose, Bld: 100 mg/dL — ABNORMAL HIGH (ref 70–99)
HCT: 43 % (ref 36.0–46.0)
Hemoglobin: 14.6 g/dL (ref 12.0–15.0)
Potassium: 4.2 mmol/L (ref 3.5–5.1)
Sodium: 139 mmol/L (ref 135–145)
TCO2: 23 mmol/L (ref 22–32)

## 2021-02-16 LAB — COMPREHENSIVE METABOLIC PANEL
ALT: 17 U/L (ref 0–44)
AST: 20 U/L (ref 15–41)
Albumin: 4.9 g/dL (ref 3.5–5.0)
Alkaline Phosphatase: 52 U/L (ref 38–126)
Anion gap: 11 (ref 5–15)
BUN: 13 mg/dL (ref 6–20)
CO2: 24 mmol/L (ref 22–32)
Calcium: 9.8 mg/dL (ref 8.9–10.3)
Chloride: 107 mmol/L (ref 98–111)
Creatinine, Ser: 0.64 mg/dL (ref 0.44–1.00)
GFR, Estimated: >60 mL/min (ref >60)
Glucose, Bld: 103 mg/dL — ABNORMAL HIGH (ref 70–99)
Potassium: 4.2 mmol/L (ref 3.5–5.1)
Sodium: 142 mmol/L (ref 135–145)
Total Bilirubin: 1 mg/dL (ref 0.3–1.2)
Total Protein: 8.6 g/dL — ABNORMAL HIGH (ref 6.5–8.1)

## 2021-02-16 LAB — RAPID URINE DRUG SCREEN, HOSP PERFORMED
Amphetamines: POSITIVE — AB
Barbiturates: NOT DETECTED
Benzodiazepines: NOT DETECTED
Cocaine: NOT DETECTED
Opiates: NOT DETECTED
Tetrahydrocannabinol: NOT DETECTED

## 2021-02-16 LAB — HEPATITIS C ANTIBODY
Hepatitis C Ab: NONREACTIVE
SIGNAL TO CUT-OFF: 0.01 (ref <1.00)

## 2021-02-16 LAB — I-STAT BETA HCG BLOOD, ED (MC, WL, AP ONLY): I-stat hCG, quantitative: <5 m[IU]/mL (ref <5)

## 2021-02-16 LAB — HIV ANTIBODY (ROUTINE TESTING W REFLEX): HIV 1&2 Ab, 4th Generation: NONREACTIVE

## 2021-02-16 MED ORDER — IOHEXOL 300 MG/ML  SOLN
100.0000 mL | Freq: Once | INTRAMUSCULAR | Status: AC | PRN
  Administered 2021-02-16: 100 mL via INTRAVENOUS

## 2021-02-16 MED ORDER — ONDANSETRON HCL 4 MG/2ML IJ SOLN
4.0000 mg | Freq: Once | INTRAMUSCULAR | Status: AC
  Administered 2021-02-16: 4 mg via INTRAVENOUS
  Filled 2021-02-16: qty 2

## 2021-02-16 MED ORDER — DICYCLOMINE HCL 20 MG PO TABS
20.0000 mg | ORAL_TABLET | Freq: Two times a day (BID) | ORAL | 0 refills | Status: DC
Start: 2021-02-16 — End: 2021-03-06

## 2021-02-16 MED ORDER — DICYCLOMINE HCL 10 MG/ML IM SOLN
20.0000 mg | Freq: Once | INTRAMUSCULAR | Status: AC
  Administered 2021-02-16: 20 mg via INTRAMUSCULAR
  Filled 2021-02-16: qty 2

## 2021-02-16 MED ORDER — KETOROLAC TROMETHAMINE 30 MG/ML IJ SOLN
30.0000 mg | Freq: Once | INTRAMUSCULAR | Status: AC
  Administered 2021-02-16: 30 mg via INTRAVENOUS
  Filled 2021-02-16: qty 1

## 2021-02-16 NOTE — ED Triage Notes (Signed)
Patient arrived with complaints of abdominal pain x2 day, NVD. Seen recently for same. Reporting some depression from a recent breakup but declines SI/HI

## 2021-02-16 NOTE — ED Provider Notes (Signed)
[facility]-EMERGENCY DEPT Provider Note   CSN: [id number] Arrival date & time: 02/16/21  0008     History Chief Complaint  Patient presents with  . Abdominal Pain    Nicole Huff is a 30 y.o. female.  The history is provided by the patient.  Abdominal Pain Pain location:  Generalized Pain quality: cramping   Pain radiates to:  Does not radiate Pain severity:  Moderate Onset quality:  Gradual Timing:  Constant Progression:  Unchanged Chronicity:  New Context: not alcohol use, not sick contacts and not suspicious food intake   Relieved by:  Nothing Worsened by:  Nothing Ineffective treatments:  None tried Associated symptoms: diarrhea, nausea and vomiting   Associated symptoms: no anorexia, no belching, no chest pain, no chills, no constipation, no cough, no dysuria, no fatigue, no fever, no flatus, no hematemesis, no hematochezia, no hematuria, no melena, no shortness of breath, no sore throat, no vaginal bleeding and no vaginal discharge   Risk factors: no alcohol abuse        Past Medical History:  Diagnosis Date  . Medical history non-contributory     Patient Active Problem List   Diagnosis Date Noted  . Nausea vomiting and diarrhea 02/13/2021    Past Surgical History:  Procedure Laterality Date  . NO PAST SURGERIES       OB History    Gravida  0   Para  0   Term  0   Preterm  0   AB  0   Living  0     SAB  0   IAB  0   Ectopic  0   Multiple  0   Live Births              Family History  Problem Relation Age of Onset  . Thyroid disease Mother   . Thyroid disease Maternal Grandmother   . Diabetes Maternal Grandfather     Social History   Tobacco Use  . Smoking status: Never Smoker  . Smokeless tobacco: Never Used  Substance Use Topics  . Alcohol use: Yes    Comment: rarely  . Drug use: No    Home Medications Prior to Admission medications   Medication Sig Start Date End Date Taking?  Authorizing Provider  pantoprazole (PROTONIX) 40 MG tablet Take 1 tablet (40 mg total) by mouth daily. 01/27/21  Yes Petrucelli, Samantha R, PA-C  sucralfate (CARAFATE) 1 GM/10ML suspension Take 10 mLs (1 g total) by mouth 4 (four) times daily -  with meals and at bedtime. 01/27/21  Yes Petrucelli, Samantha R, PA-C  dicyclomine (BENTYL) 20 MG tablet Take 1 tablet (20 mg total) by mouth every 8 (eight) hours as needed for spasms. Patient not taking: No sig reported 01/27/21   Petrucelli, Lelon Mast R, PA-C  promethazine (PHENERGAN) 25 MG tablet Take 1 tablet (25 mg total) by mouth every 8 (eight) hours as needed for nausea or vomiting. Patient not taking: Reported on 02/16/2021 01/27/21   Petrucelli, Pleas Koch, PA-C    Allergies    Patient has no known allergies.  Review of Systems   Review of Systems  Constitutional: Negative for chills, fatigue and fever.  HENT: Negative for sore throat.   Eyes: Negative for visual disturbance.  Respiratory: Negative for cough and shortness of breath.   Cardiovascular: Negative for chest pain.  Gastrointestinal: Positive for abdominal pain, diarrhea, nausea and vomiting. Negative for anorexia, constipation, flatus, hematemesis, hematochezia and melena.  Genitourinary: Negative  for dysuria, hematuria, vaginal bleeding and vaginal discharge.  Musculoskeletal: Negative for arthralgias.  Skin: Negative for rash.  Neurological: Negative for dizziness.  All other systems reviewed and are negative.   Physical Exam Updated Vital Signs BP 102/67   Pulse 65   Temp (!) 97.2 F (36.2 C) (Axillary)   Resp 19   LMP 01/25/2021   SpO2 97%   Physical Exam Vitals and nursing note reviewed.  Constitutional:      General: She is not in acute distress.    Appearance: Normal appearance.  HENT:     Head: Normocephalic and atraumatic.     Nose: Nose normal.  Eyes:     Conjunctiva/sclera: Conjunctivae normal.     Pupils: Pupils are equal, round, and reactive to light.   Cardiovascular:     Rate and Rhythm: Normal rate and regular rhythm.     Pulses: Normal pulses.     Heart sounds: Normal heart sounds.  Pulmonary:     Effort: Pulmonary effort is normal.     Breath sounds: Normal breath sounds.  Abdominal:     General: Abdomen is flat. Bowel sounds are normal.     Palpations: Abdomen is soft.     Tenderness: There is no abdominal tenderness. There is no guarding or rebound.  Musculoskeletal:        General: Normal range of motion.     Cervical back: Normal range of motion and neck supple.  Skin:    General: Skin is warm and dry.     Capillary Refill: Capillary refill takes less than 2 seconds.  Neurological:     General: No focal deficit present.     Mental Status: She is alert and oriented to person, place, and time.     Deep Tendon Reflexes: Reflexes normal.  Psychiatric:        Mood and Affect: Mood normal.        Behavior: Behavior normal.     ED Results / Procedures / Treatments   Labs (all labs ordered are listed, but only abnormal results are displayed) Results for orders placed or performed during the hospital encounter of 02/16/21  CBC with Differential/Platelet  Result Value Ref Range   WBC 9.0 4.0 - 10.5 K/uL   RBC 5.05 3.87 - 5.11 MIL/uL   Hemoglobin 14.9 12.0 - 15.0 g/dL   HCT 51.7 61.6 - 07.3 %   MCV 87.1 80.0 - 100.0 fL   MCH 29.5 26.0 - 34.0 pg   MCHC 33.9 30.0 - 36.0 g/dL   RDW 71.0 62.6 - 94.8 %   Platelets 240 150 - 400 K/uL   nRBC 0.0 0.0 - 0.2 %   Neutrophils Relative % 84 %   Neutro Abs 7.6 1.7 - 7.7 K/uL   Lymphocytes Relative 7 %   Lymphs Abs 0.6 (L) 0.7 - 4.0 K/uL   Monocytes Relative 8 %   Monocytes Absolute 0.7 0.1 - 1.0 K/uL   Eosinophils Relative 1 %   Eosinophils Absolute 0.1 0.0 - 0.5 K/uL   Basophils Relative 0 %   Basophils Absolute 0.0 0.0 - 0.1 K/uL   Immature Granulocytes 0 %   Abs Immature Granulocytes 0.03 0.00 - 0.07 K/uL  Comprehensive metabolic panel  Result Value Ref Range   Sodium  142 135 - 145 mmol/L   Potassium 4.2 3.5 - 5.1 mmol/L   Chloride 107 98 - 111 mmol/L   CO2 24 22 - 32 mmol/L   Glucose, Bld 103 (H) 70 -  99 mg/dL   BUN 13 6 - 20 mg/dL   Creatinine, Ser 1.61 0.44 - 1.00 mg/dL   Calcium 9.8 8.9 - 09.6 mg/dL   Total Protein 8.6 (H) 6.5 - 8.1 g/dL   Albumin 4.9 3.5 - 5.0 g/dL   AST 20 15 - 41 U/L   ALT 17 0 - 44 U/L   Alkaline Phosphatase 52 38 - 126 U/L   Total Bilirubin 1.0 0.3 - 1.2 mg/dL   GFR, Estimated >04 >54 mL/min   Anion gap 11 5 - 15  Urinalysis, Routine w reflex microscopic Urine, Clean Catch  Result Value Ref Range   Color, Urine AMBER (A) YELLOW   APPearance HAZY (A) CLEAR   Specific Gravity, Urine 1.024 1.005 - 1.030   pH 5.0 5.0 - 8.0   Glucose, UA NEGATIVE NEGATIVE mg/dL   Hgb urine dipstick NEGATIVE NEGATIVE   Bilirubin Urine NEGATIVE NEGATIVE   Ketones, ur 20 (A) NEGATIVE mg/dL   Protein, ur 30 (A) NEGATIVE mg/dL   Nitrite NEGATIVE NEGATIVE   Leukocytes,Ua NEGATIVE NEGATIVE   RBC / HPF 0-5 0 - 5 RBC/hpf   WBC, UA 0-5 0 - 5 WBC/hpf   Bacteria, UA RARE (A) NONE SEEN   Squamous Epithelial / LPF 0-5 0 - 5   Mucus PRESENT    Hyaline Casts, UA PRESENT   Rapid urine drug screen (hospital performed)  Result Value Ref Range   Opiates NONE DETECTED NONE DETECTED   Cocaine NONE DETECTED NONE DETECTED   Benzodiazepines NONE DETECTED NONE DETECTED   Amphetamines POSITIVE (A) NONE DETECTED   Tetrahydrocannabinol NONE DETECTED NONE DETECTED   Barbiturates NONE DETECTED NONE DETECTED  I-stat chem 8, ED (not at [facility] or [facility])  Result Value Ref Range   Sodium 139 135 - 145 mmol/L   Potassium 4.2 3.5 - 5.1 mmol/L   Chloride 104 98 - 111 mmol/L   BUN 11 6 - 20 mg/dL   Creatinine, Ser 0.98 0.44 - 1.00 mg/dL   Glucose, Bld 119 (H) 70 - 99 mg/dL   Calcium, Ion 1.47 8.29 - 1.40 mmol/L   TCO2 23 22 - 32 mmol/L   Hemoglobin 14.6 12.0 - 15.0 g/dL   HCT 56.2 13.0 - 86.5 %  I-Stat Beta hCG blood, ED ([facility], [facility], [facility] only)  Result Value Ref Range    I-stat hCG, quantitative <5.0 <5 mIU/mL   Comment 3           CT ABDOMEN PELVIS W CONTRAST  Result Date: 02/16/2021 CLINICAL DATA:  Abdominal pain.  Non bilious vomiting. EXAM: CT ABDOMEN AND PELVIS WITH CONTRAST TECHNIQUE: Multidetector CT imaging of the abdomen and pelvis was performed using the standard protocol following bolus administration of intravenous contrast. CONTRAST:  OMNIPAQUE IOHEXOL 300 MG/ML  SOLN COMPARISON:  Ultrasound pelvis 06/20/2015 report FINDINGS: Lower chest: No acute abnormality. Hepatobiliary: Insert CT livers Pancreas: No focal lesion. Normal pancreatic contour. No surrounding inflammatory changes. No main pancreatic ductal dilatation. Spleen: Normal in size without focal abnormality. Adrenals/Urinary Tract: No adrenal nodule bilaterally. Bilateral kidneys enhance symmetrically. No hydronephrosis. No hydroureter. The urinary bladder is unremarkable. Stomach/Bowel: Stomach is within normal limits. No evidence of bowel wall thickening or dilatation. Fluid density within the large bowel is chest the fast transition state. Appendix appears normal. Vascular/Lymphatic: No significant vascular findings are present. No enlarged abdominal or pelvic lymph nodes. Reproductive: 3.1 cm simple cystic lesion within the right adnexa. No follow-up imaging recommended. Note: This recommendation does not apply to  premenarchal patients and to those with increased risk (genetic, family history, elevated tumor markers or other high-risk factors) of ovarian cancer. Reference: JACR 2020 Feb; 17(2):248-254. Otherwise the uterus and bilateral adnexa are unremarkable. Other: Trace simple free fluid within the pelvis which can be physiologic in etiology. No intraperitoneal free gas. No organized fluid collection. Musculoskeletal: No acute or significant osseous findings. IMPRESSION: 1. Fast transition state. Otherwise no acute intra-abdominal or intrapelvic abnormality. 2. Normal appendix.  Electronically Signed   By: Tish Frederickson M.D.   On: 02/16/2021 04:07    EKG None  Radiology CT ABDOMEN PELVIS W CONTRAST  Result Date: 02/16/2021 CLINICAL DATA:  Abdominal pain.  Non bilious vomiting. EXAM: CT ABDOMEN AND PELVIS WITH CONTRAST TECHNIQUE: Multidetector CT imaging of the abdomen and pelvis was performed using the standard protocol following bolus administration of intravenous contrast. CONTRAST:  OMNIPAQUE IOHEXOL 300 MG/ML  SOLN COMPARISON:  Ultrasound pelvis 06/20/2015 report FINDINGS: Lower chest: No acute abnormality. Hepatobiliary: Insert CT livers Pancreas: No focal lesion. Normal pancreatic contour. No surrounding inflammatory changes. No main pancreatic ductal dilatation. Spleen: Normal in size without focal abnormality. Adrenals/Urinary Tract: No adrenal nodule bilaterally. Bilateral kidneys enhance symmetrically. No hydronephrosis. No hydroureter. The urinary bladder is unremarkable. Stomach/Bowel: Stomach is within normal limits. No evidence of bowel wall thickening or dilatation. Fluid density within the large bowel is chest the fast transition state. Appendix appears normal. Vascular/Lymphatic: No significant vascular findings are present. No enlarged abdominal or pelvic lymph nodes. Reproductive: 3.1 cm simple cystic lesion within the right adnexa. No follow-up imaging recommended. Note: This recommendation does not apply to premenarchal patients and to those with increased risk (genetic, family history, elevated tumor markers or other high-risk factors) of ovarian cancer. Reference: JACR 2020 Feb; 17(2):248-254. Otherwise the uterus and bilateral adnexa are unremarkable. Other: Trace simple free fluid within the pelvis which can be physiologic in etiology. No intraperitoneal free gas. No organized fluid collection. Musculoskeletal: No acute or significant osseous findings. IMPRESSION: 1. Fast transition state. Otherwise no acute intra-abdominal or intrapelvic  abnormality. 2. Normal appendix. Electronically Signed   By: Tish Frederickson M.D.   On: 02/16/2021 04:07    Procedures Procedures   Medications Ordered in ED Medications  dicyclomine (BENTYL) injection 20 mg (has no administration in time range)  ondansetron (ZOFRAN) injection 4 mg (4 mg Intravenous Given 02/16/21 0104)  ketorolac (TORADOL) 30 MG/ML injection 30 mg (30 mg Intravenous Given 02/16/21 0248)  iohexol (OMNIPAQUE) 300 MG/ML solution 100 mL (100 mLs Intravenous Contrast Given 02/16/21 0345)    ED Course  I have reviewed the triage vital signs and the nursing notes.  Pertinent labs & imaging results that were available during my care of the patient were reviewed by me and considered in my medical decision making (see chart for details).  No more emesis in the department. No acute findings on CT.  I will refer to GI   ROSEMOND LYTTLE was evaluated in Emergency Department on 02/16/2021 for the symptoms described in the history of present illness. She was evaluated in the context of the global COVID-19 pandemic, which necessitated consideration that the patient might be at risk for infection with the SARS-CoV-2 virus that causes COVID-19. Institutional protocols and algorithms that pertain to the evaluation of patients at risk for COVID-19 are in a state of rapid change based on information released by regulatory bodies including the CDC and federal and state organizations. These policies and algorithms were followed during the patient's care in  the ED.  Final Clinical Impression(s) / ED Diagnoses  Return for intractable cough, coughing up blood, fevers >100.4 unrelieved by medication, shortness of breath, intractable vomiting, chest pain, shortness of breath, weakness, numbness, changes in speech, facial asymmetry, abdominal pain, passing out, Inability to tolerate liquids or food, cough, altered mental status or any concerns. No signs of systemic illness or infection. The patient is  nontoxic-appearing on exam and vital signs are within normal limits.  I have reviewed the triage vital signs and the nursing notes. Pertinent labs & imaging results that were available during my care of the patient were reviewed by me and considered in my medical decision making (see chart for details). After history, exam, and medical workup I feel the patient has been appropriately medically screened and is safe for discharge home. Pertinent diagnoses were discussed with the patient. Patient was given return precautions.   Hellen Shanley, MD 02/16/21 [phone]

## 2021-02-16 NOTE — H&P (Addendum)
Behavioral Health Medical Screening Exam  Nicole Huff is a 30 y.o. female who presented to the [facility] with complaints of "suicidal thoughts, worthlessness, and anxiety" requesting outpatient resources.    Nicole Huff stated that she broke up with her boyfriend of six years one week ago. She stated that the two dated off and on for six years, and that the break up was "sudden, no fight," without an explanation. She stated that it was a long distance relationship and that her ex-boyfriend lives two hours away. She stated that she drove two hours to see him, to talk to him, and that she for waited for six hours, but he did not talk to her.  On approach, Nicole Huff is dressed in scrubs. She is alert and oriented x 4. She is calm, cooperative, goal oriented, and answers questions logically. She denied having suicidal thoughts and stated she's been having thoughts of "how can I do it without causing pain."  She stated that the last time she had suicidal thoughts was yesterday when she asked her sister who lives three hours away, if she knows anyone in [place] that could give her something to "feel numb." She stated that her sister told her "no." She denies self harm behaviors. She denies having suicidal thought today, and verbally contracts for safety to return home. She denies homicidal thoughts. She describes symptoms of depression as "not eating a lot, crying a lot, anxiety, and wanting to be alone," since the break up one week ago.  She states that she would like someone to talk to, and is looking for a referral for outpatient services. She states that she was told that she needs a referral for outpatient treatment. She denies auditory and visual hallucinations. She does not appear to be responding to internal, or external stimuli. She denies using illicit drugs, or consuming alcohol. She denies previous psychiatric treatments, or hospitalizations.   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 She states that she lives at home with her mother, mother's husband, and grandmother. She states that everyone in the home gets along. She identifies her mother as her support person. She identifies "going home and talking to mother" as ways to keep herself safe. This provider discussed treatment options with the patient that included outpatient services, overnight observation and inpatient hospitalization. Nicole Huff declined observation and inpatient treatment and stated that she just "need someone she can talk to."  She gave verbal consent for TTS counselor/NP to discuss a safety plan with her mother. Nicole Huff, Nicole Huff's mother, was informed that Nicole Huff would be returning home, a safety plan was established with both Nicole Huff and Nicole Huff, and Nicole Huff was provided with mental health outpatient resources as requested.     Total Time spent with patient: 15 minutes  Psychiatric Specialty Exam:  Presentation  General Appearance: Appropriate for Environment  Eye Contact:Fair  Speech:Clear and Coherent  Speech Volume:Normal  Handedness:Right   Mood and Affect  Mood:Depressed  Affect:Congruent   Thought Process  Thought Processes:Coherent; Goal Directed  Descriptions of Associations:Intact  Orientation:Full (Time, Place and Person)  Thought Content:Logical  History of Schizophrenia/Schizoaffective disorder:No data recorded Duration of Psychotic Symptoms:No data recorded Hallucinations:Hallucinations: None  Ideas of Reference:None  Suicidal Thoughts:Suicidal Thoughts: Yes, Passive SI Passive Intent and/or Plan: Without Intent; Without Plan  Homicidal Thoughts:No data recorded  Sensorium  Memory:Immediate Good; Recent Good; Remote Good  Judgment:Fair  Insight:Fair   Executive Functions  Concentration:Fair  Attention Span:Fair  Recall:Fair  Fund of Knowledge:Fair  Language:Fair   Psychomotor Activity  Psychomotor  Activity:Psychomotor Activity: Normal   Assets  Assets:Communication Skills; Desire for Improvement; Financial Resources/Insurance; Housing; Leisure Time; Physical Health; Social Support; Transportation   Sleep  Sleep:Sleep: Fair Number of Hours of Sleep: 0 ("varies")    Physical Exam: Physical Exam Constitutional:      Appearance: Normal appearance.  HENT:     Head: Normocephalic and atraumatic.     Nose: Nose normal.  Cardiovascular:     Rate and Rhythm: Normal rate.     Pulses: Normal pulses.  Pulmonary:     Effort: Pulmonary effort is normal.     Breath sounds: Normal breath sounds.  Musculoskeletal:        General: Normal range of motion.     Cervical back: Normal range of motion.  Neurological:     General: No focal deficit present.     Mental Status: She is alert and oriented to person, place, and time.  Psychiatric:        Behavior: Behavior normal.        Thought Content: Thought content normal.        Judgment: Judgment normal.    Review of Systems  Constitutional: Negative.   HENT: Negative.   Eyes: Negative.   Respiratory: Negative.   Cardiovascular: Negative.   Gastrointestinal:       Evaluated at the emergency department today for abdominal pain and vomiting.   Genitourinary: Negative.   Musculoskeletal: Negative.   Skin: Negative.   Neurological: Negative.   Endo/Heme/Allergies: Negative.   Psychiatric/Behavioral: Positive for depression. Negative for hallucinations, substance abuse and suicidal ideas. The patient is nervous/anxious. The patient does not have insomnia.    Blood pressure 100/70, pulse 79, temperature 98.3 F (36.8 C), temperature source Oral, resp. rate 18, last menstrual period 01/25/2021, SpO2 100 %. There is no height or weight on file to calculate BMI.  Musculoskeletal: Strength & Muscle Tone: within normal limits Gait & Station: normal Patient leans: N/A   Recommendations:  Based on my evaluation the patient does not  appear to have an emergency medical condition.  Based on my assessment, Nicole Huff is not a threat to herself, or others. She denies SI/HI at this time. She denies AVH, and does not appear psychotic. Nicole Huff can benefit from mental health outpatient services, and was recommended to follow up with mental health outpatient services as provided.    Layla Barter, NP 02/16/2021, 7:42 PM

## 2021-02-16 NOTE — BH Assessment (Signed)
Comprehensive Clinical Assessment (CCA) Note  02/16/2021 Nicole Huff [id number]   Disposition Liborio Nixon, NP, patient does not meet inpatient criteria. Patient requesting to outpatient referral/resources. TTS clinician provided patient with multiple outpatient resources. Patient will follow up with outpatient therapy.   The patient demonstrates the following risk factors for suicide: Chronic risk factors for suicide include: N/A. Acute risk factors for suicide include: end of long term relationshp. Protective factors for this patient include: responsibility to others (children, family) and hope for the future. Considering these factors, the overall suicide risk at this point appears to be low. Patient is appropriate for outpatient follow up.  Flowsheet Row ED from 02/16/2021 in [facility] from 01/27/2021 in [facility] from 01/26/2021 in [facility]   C-SSRS RISK CATEGORY No Risk No Risk No Risk     Nicole Huff is a 30 year old female presenting to [facility] due to SI with no plan and requesting requesting outpatient therapy resources. Patient reported no current SI, last SI was on yesterday when she was thinking "how can I do it without causing pain". Patient reported onset of SI was 1 week ago after breaking up with boyfriend of 6 years. Patient reported "he broke up with me suddenly and doesn't want to have anything to do with me, I drove 2 hours to talk to him and he wouldn't see or talk to me". Patient reported worsening depressive symptoms. Patient was seen earlier today at [facility] for abdominal pain, which patient feels was triggered by her "anxiety attack". Patient denies HI and psychosis.  Patient denied prior psych hospitalizations, suicide attempts and self-harming behaviors. Patient denied receiving any outpatient mental health services. Patient denied being prescribed any psych  medications.   Patient currently resides with mother, grandmother and mothers husband. Patient denied family discord. 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 Patient denied access to guns.   Collateral contact, [name], mother, [phone], consent given by patient for additional information and safety plan. Mother shared concerns of patients SI and long term on and off relationship with current ex-boyfriend. Mother reported no mental health history and that patient becomes depressed whenever she and boyfriend goes through there breakups. Mother reported patient is determined to find an outpatient therapy provider so she can have someone to talk to. Safety plan was established with both mother and patient.  Chief Complaint:  Chief Complaint  Patient presents with  . Psychiatric Evaluation   Visit Diagnosis: Major depressive disorder  CCA Screening, Triage and Referral (STR)  Patient Reported Information How did you hear about [place]? Self  Referral name: No data recorded Referral phone number: No data recorded  Whom do you see for routine medical problems? Primary Care  Practice/Facility Name: [facility]  Practice/Facility Phone Number: No data recorded Name of Contact: No data recorded Contact Number: No data recorded Contact Fax Number: No data recorded Prescriber Name: No data recorded Prescriber Address (if known): No data recorded  What Is the Reason for Your Visit/Call Today? SI with no plan. Requesting outpatient therapy services.  How Long Has This Been Causing You Problems? 1 wk - 1 month  What Do You Feel Would Help You the Most Today? Treatment for Depression or other mood problem  Have You Recently Been in Any Inpatient Treatment (Hospital/Detox/Crisis Center/28-Day Program)? No  Name/Location of Program/Hospital:No data recorded How Long Were You There? No data  recorded When Were You Discharged? No data  recorded  Have You [facility] Re[facility] From [facility]? No data recorded Who [facility] You [facility]? No data recorded  Have You Recently Had Any Thoughts About Hurting Yourself? Yes (SI on yesterday)  Are You Planning to Commit Suicide/Harm Yourself At This time? No  Have you Recently Had Thoughts About Hurting Someone Karolee Ohslse? No  Explanation: No data recorded  Have You Used Any Alcohol or Drugs in the Past 24 Hours? No  How Long Ago Did You Use Drugs or Alcohol? No data recorded What Did You Use and How Much? No data recorded  Do You Currently Have a Therapist/Psychiatrist? No  Name of Therapist/Psychiatrist: No data recorded  Have You Been Recently Discharged From Any Office Practice or Programs? No  Explanation of Discharge From Practice/Program: No data recorded   CCA Screening Triage Referral Assessment Type of Contact: Face-to-Face  Is this Initial or Reassessment? No data recorded Date Telepsych consult ordered in [facility]:  No data recorded Time Telepsych consult ordered in [facility]:  No data recorded  Patient Reported Information Reviewed? Yes  Patient Left Without Being Seen? No data recorded Reason for Not Completing Assessment: No data recorded  Collateral Involvement: Yevonne AlineLynn Morgan, mother, [phone]  Does Patient Have a Court Appointed Legal Guardian? No data recorded Name and Contact of Legal Guardian: No data recorded If Minor and Not Living with Parent(s), Who has Custody? No data recorded Is CPS involved or ever been involved? Never  Is APS involved or ever been involved? Never  Patient Determined To Be At Risk for Harm To Self or Others Based on Review of Patient Reported Information or Presenting Complaint? No  Method: No data recorded Availability of Means: No data recorded Intent: No data recorded Notification Required: No data recorded Additional Information for Danger to Others Potential: No data recorded Additional Comments for Danger  to Others Potential: No data recorded Are There Guns or Other Weapons in Your Home? No data recorded Types of Guns/Weapons: No data recorded Are These Weapons Safely Secured?                            No data recorded Who Could Verify You Are Able To Have These Secured: No data recorded Do You Have any Outstanding Charges, Pending Court Dates, Parole/Probation? No data recorded Contacted To Inform of Risk of Harm To Self or Others: No data recorded  [facility]  Does Patient Present under Involuntary Commitment? No  IVC Papers Initial File Date: No data recorded  [place]  Patient Currently Receiving the Following Services: Not Receiving Services  Determination of Need: Routine (7 days)  Options For Referral: Medication Management; Intensive Outpatient Therapy  CCA Biopsychosocial Intake/Chief Complaint:  SI with no plan, requesting outpatient therapy services.  Current Symptoms/Problems: Worsening depressive symptoms.  Patient Reported Schizophrenia/Schizoaffective Diagnosis in Past: No  Strengths: self-awareness  Preferences: to start outpatient therapy as soon as possible  Abilities: No data recorded  Type of Services Patient Feels are Needed: outpatient therapy  Initial Clinical Notes/Concerns: No data recorded  Mental Health Symptoms Depression:  Change in energy/activity; Increase/decrease in appetite; Worthlessness; Sleep (too much or little); Fatigue; Hopelessness; Tearfulness   Duration of Depressive symptoms: Greater than two weeks   Mania:  None   Anxiety:   Fatigue; Restlessness; Sleep; Worrying   Psychosis:  None   Duration of Psychotic  symptoms: No data recorded  Trauma:  None   Obsessions:  None   Compulsions:  None   Inattention:  None   Hyperactivity/Impulsivity:  N/A   Oppositional/Defiant Behaviors:  None   Emotional Irregularity:  None   Other Mood/Personality Symptoms:   No data recorded   Mental Status Exam Appearance and self-care  Stature:  Average   Weight:  Thin   Clothing:  Neat/clean   Grooming:  Normal   Cosmetic use:  None   Posture/gait:  Normal   Motor activity:  Not Remarkable   Sensorium  Attention:  Normal   Concentration:  Normal   Orientation:  X5   Recall/memory:  Normal   Affect and Mood  Affect:  Anxious; Appropriate; Depressed   Mood:  Anxious; Depressed   Relating  Eye contact:  Normal   Facial expression:  Anxious; Sad   Attitude toward examiner:  Cooperative   Thought and Language  Speech flow: Clear and Coherent   Thought content:  Appropriate to Mood and Circumstances   Preoccupation:  None   Hallucinations:  None   Organization:  No data recorded  [employer] of Knowledge:  Average   Intelligence:  Average   Abstraction:  Normal   Judgement:  Good   Reality Testing:  Realistic   Insight:  Good   Decision Making:  Normal   Social Functioning  Social Maturity:  Responsible   Social Judgement:  Normal   Stress  Stressors:  Relationship   Coping Ability:  No data recorded  Skill Deficits:  Self-care   Supports:  Family    Religion:   Leisure/Recreation: Leisure / Recreation Do You Have Hobbies?: Yes Leisure and Hobbies: was riding 4-wheelers with boyfriend  Exercise/Diet: Exercise/Diet Do You Have Any Trouble Sleeping?: Yes Explanation of Sleeping Difficulties: varies  CCA Employment/Education Employment/Work Situation: Employment / Work Environmental consultant job has been impacted by current illness: No 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 Has patient ever been in the [employer]?: No  Education: Education Is Patient Currently Attending School?: No Did Garment/textile technologist From [employer]?: Yes Did Theme park manager?: Yes What Type of College Degree Do you  Have?: medical Did You Have Any Special Interests In School?: dentist Did You Have An Individualized Education Program (IIEP): No Did You Have Any Difficulty At School?: No Patient's Education Has Been Impacted by Current Illness: No  CCA Family/Childhood History Family and Relationship History: Family history Does patient have children?: No  Childhood History:  Childhood History Description of patient's relationship with caregiver when they were a child: good Patient's description of current relationship with people who raised him/her: good How were you disciplined when you got in trouble as a child/adolescent?: normal Does patient have siblings?: Yes Number of Siblings: 1 Description of patient's current relationship with siblings: good Did patient suffer any verbal/emotional/physical/sexual abuse as a child?: No Did patient suffer from severe childhood neglect?: No Has patient ever been sexually abused/assaulted/raped as an adolescent or adult?: No Was the patient ever a victim of a crime or a disaster?: No Witnessed domestic violence?: No Has patient been affected by domestic violence as an adult?: No  Child/Adolescent Assessment:   CCA Substance Use Alcohol/Drug Use: Alcohol / Drug Use Pain Medications: see MAR Prescriptions: see MAR Over the Counter: see MAR History of alcohol / drug use?: No history of alcohol / drug abuse  ASAM's:  Six Dimensions of Multidimensional Assessment  Dimension 1:  Acute Intoxication and/or Withdrawal Potential:      Dimension 2:  Biomedical Conditions and Complications:      Dimension 3:  Emotional, Behavioral, or Cognitive Conditions and Complications:     Dimension 4:  Readiness to Change:     Dimension 5:  Relapse, Continued use, or Continued Problem Potential:     Dimension 6:  Recovery/Living Environment:     ASAM Severity Score:    ASAM Recommended Level of Treatment:     Substance use Disorder (SUD)   Recommendations for  Services/Supports/Treatments: Recommendations for Services/Supports/Treatments Recommendations For Services/Supports/Treatments: Individual Therapy,Medication Management  DSM5 Diagnoses: Patient Active Problem List   Diagnosis Date Noted  . Nausea vomiting and diarrhea 02/13/2021   Patient Centered Plan: Patient is on the following Treatment Plan(s):    Referrals to Alternative Service(s): Referred to Alternative Service(s):   Place:   Date:   Time:    Referred to Alternative Service(s):   Place:   Date:   Time:    Referred to Alternative Service(s):   Place:   Date:   Time:    Referred to Alternative Service(s):   Place:   Date:   Time:     Burnetta Sabin, [facility]

## 2021-02-18 NOTE — Telephone Encounter (Signed)
This needs to be scheduled as a visit ASAP!! Can we call her to schedule with me for this week?

## 2021-02-20 ENCOUNTER — Other Ambulatory Visit: Payer: Self-pay

## 2021-02-20 ENCOUNTER — Encounter: Payer: Self-pay | Admitting: Primary Care

## 2021-02-20 ENCOUNTER — Ambulatory Visit: Payer: 59 | Admitting: Primary Care

## 2021-02-20 VITALS — BP 96/74 | HR 88 | Temp 97.8°F | Ht 67.0 in | Wt 105.0 lb

## 2021-02-20 DIAGNOSIS — F4323 Adjustment disorder with mixed anxiety and depressed mood: Secondary | ICD-10-CM

## 2021-02-20 MED ORDER — SERTRALINE HCL 25 MG PO TABS: 25.0000 mg | ORAL_TABLET | Freq: Every day | ORAL | 0 refills | Status: DC

## 2021-02-20 NOTE — Assessment & Plan Note (Addendum)
Chronic for years, worse since the end of her 6 year long relationship with her boyfriend.  Anxiety and depression active, will treat. Discused options for treating which includes medication and therapy. She opts for both.  When asking about SI/HI, I discussed that I read in her chart that her suicide preference would be harm without pain, we discussed this and potentially overdosing with medication. She contracts to safety and ensures that she will not overdose.  Referral placed for therapy.  Rx for Zoloft 25 mg sent to pharmacy, only 30 tablets.  We discussed possible side effects of headache, GI upset, drowsiness, and SI/HI. If thoughts of SI/HI develop, we discussed to present to the emergency immediately. Patient verbalized understanding.   We will watch her closely, follow up in 2-3 weeks for re-evaluation. Strict ED precautions provided.

## 2021-02-20 NOTE — Progress Notes (Signed)
Subjective:    Patient ID: Nicole Huff, female    DOB: 1991-08-14, 30 y.o.   MRN: [id number]  HPI  Nicole Huff is a very pleasant 30 y.o. female patient of Deboraha Sprang who presents today to discuss anxiety and depression.  She was last evaluated by me on 02/13/21 for ED follow up nausea and vomiting. During this visit she endorsed feeling better on the prescribed regimen so we discussed to continue medications as needed.  Since her last visit she was evaluated at [facility] on 4/25 for reports of abdominal pain and vomiting. CT scan was negative. She was treated with IV Bentyl, Zofran, Toradol. She was referred to GI for further evaluation. She was also evaluated by behavorial health for SI? SI began one week prior after a break up with her boyfriend of 6 years. She endorsed decreased appetite, tearfulness, anxiety, wanting to be alone. It was recommended that she seek outpatient mental health services. She sent a my chart message on 04/27 requesting some help so we asked her to come in today.  Today she endorses feeling very stressed, sad, anxious since her break up. She drove up to her ex-boyfriends house, 2 hour drive, to speak with him and he refused to speak with her. He won't answer phone calls or messages. She reported in her my chart message that the relationship was "toxic", she endorses that she cannot remember the bad as she may have been suppressing it. Her ex-boyfriend doesn't really listen to anyone, does whatever he wants to do. She has thought about harming herself, but "I don't want to die, I want to feel nothing for a while".   She has a list of therapists and psychiatrists but she's unable to get in with anyone as either her insurance won't cover or there is no availability. Symptoms originally began years ago, worse over the last week. She has never been treated or evaluated for her symptoms. She is ready for treatment.   Flowsheet Row Office Visit from 02/20/2021 in  [facility]  PHQ-9 Total Score 19     GAD 7 : Generalized Anxiety Score 02/20/2021  Nervous, Anxious, on Edge 3  Control/stop worrying 2  Worry too much - different things 2  Trouble relaxing 2  Restless 2  Easily annoyed or irritable 2  Afraid - awful might happen 2  Total GAD 7 Score 15  Anxiety Difficulty Somewhat difficult       Review of Systems  Constitutional: Negative for fever.  Gastrointestinal: Positive for abdominal pain and nausea. Negative for vomiting.  Psychiatric/Behavioral: The patient is nervous/anxious.        See HPI         Past Medical History:  Diagnosis Date  . Medical history non-contributory     Social History   Socioeconomic History  . Marital status: Single    Spouse name: Not on file  . Number of children: Not on file  . Years of education: Not on file  . Highest education level: Not on file  Occupational History  . Not on file  Tobacco Use  . Smoking status: Never Smoker  . Smokeless tobacco: Never Used  Substance and Sexual Activity  . Alcohol use: Yes    Comment: rarely  . Drug use: No  . Sexual activity: Yes    Birth control/protection: Implant, Pill  Other Topics Concern  . Not on file  Social History Narrative  . Not on file  Social Determinants of Health   Financial Resource Strain: Not on file  Food Insecurity: Not on file  Transportation Needs: Not on file  Physical Activity: Not on file  Stress: Not on file  Social Connections: Not on file  Intimate Partner Violence: Not on file    Past Surgical History:  Procedure Laterality Date  . NO PAST SURGERIES      Family History  Problem Relation Age of Onset  . Thyroid disease Mother   . Thyroid disease Maternal Grandmother   . Diabetes Maternal Grandfather     No Known Allergies  Current Outpatient Medications on File Prior to Visit  Medication Sig Dispense Refill  . dicyclomine (BENTYL) 20 MG tablet Take 1 tablet (20 mg  total) by mouth every 8 (eight) hours as needed for spasms. (Patient not taking: No sig reported) 30 tablet 0  . dicyclomine (BENTYL) 20 MG tablet Take 1 tablet (20 mg total) by mouth 2 (two) times daily. (Patient not taking: Reported on 02/20/2021) 20 tablet 0  . pantoprazole (PROTONIX) 40 MG tablet Take 1 tablet (40 mg total) by mouth daily. (Patient not taking: Reported on 02/20/2021) 30 tablet 0  . promethazine (PHENERGAN) 25 MG tablet Take 1 tablet (25 mg total) by mouth every 8 (eight) hours as needed for nausea or vomiting. (Patient not taking: No sig reported) 10 tablet 0  . sucralfate (CARAFATE) 1 GM/10ML suspension Take 10 mLs (1 g total) by mouth 4 (four) times daily -  with meals and at bedtime. (Patient not taking: Reported on 02/20/2021) 420 mL 0   No current facility-administered medications on file prior to visit.    BP 96/74   Pulse 88   Temp 97.8 F (36.6 C)   Ht 5\' 7"  (1.702 m)   Wt 105 lb (47.6 kg)   LMP 01/25/2021   SpO2 98%   BMI 16.45 kg/m  Objective:   Physical Exam Cardiovascular:     Rate and Rhythm: Normal rate and regular rhythm.  Pulmonary:     Effort: Pulmonary effort is normal.     Breath sounds: Normal breath sounds.  Musculoskeletal:     Cervical back: Neck supple.  Skin:    General: Skin is warm and dry.           Assessment & Plan:      This visit occurred during the SARS-CoV-2 public health emergency.  Safety protocols were in place, including screening questions prior to the visit, additional usage of staff PPE, and extensive cleaning of exam room while observing appropriate contact time as indicated for disinfecting solutions.

## 2021-02-20 NOTE — Patient Instructions (Addendum)
Start sertraline (Zoloft) 25 mg once daily for anxiety and depression.   You will be contacted regarding your referral to therapy.  Please let us know if you have not been contacted within two weeks.   Please schedule a follow up visit to meet back with me in 2-3 weeks.  It was a pleasure to see you today!

## 2021-03-06 ENCOUNTER — Ambulatory Visit (INDEPENDENT_AMBULATORY_CARE_PROVIDER_SITE_OTHER): Payer: BC Managed Care – PPO | Admitting: Primary Care

## 2021-03-06 ENCOUNTER — Encounter: Payer: Self-pay | Admitting: Primary Care

## 2021-03-06 ENCOUNTER — Other Ambulatory Visit: Payer: Self-pay

## 2021-03-06 VITALS — BP 105/58 | HR 75 | Temp 98.5°F | Ht 67.0 in | Wt 105.0 lb

## 2021-03-06 DIAGNOSIS — F4323 Adjustment disorder with mixed anxiety and depressed mood: Secondary | ICD-10-CM

## 2021-03-06 MED ORDER — SERTRALINE HCL 50 MG PO TABS: 50.0000 mg | ORAL_TABLET | Freq: Every day | ORAL | 0 refills | Status: DC

## 2021-03-06 NOTE — Assessment & Plan Note (Signed)
Appears slightly improved since last visit, unclear if this is secondary to sertraline or the time she spent at the beach with her sister.   Regardless she does appear better, she also contracts to safety for SI thoughts. We agreed to increase her dose of Zoloft to 50 mg with close follow up.  She will call to set up a therapy appointment. We will plan to see her back in 2 weeks.

## 2021-03-06 NOTE — Patient Instructions (Signed)
We increased your dose of sertraline (Zoloft) to 50 mg from 25 mg. I sent a new prescription to your pharmacy.  Please schedule a follow up visit for 2-3 weeks for follow up of anxiety/depression.  Call the therapist to set up a visit.   It was a pleasure to see you today!

## 2021-03-06 NOTE — Progress Notes (Addendum)
Subjective:    Patient ID: Nicole Huff, female    DOB: [birth year], 30 y.o.   MRN: [id number]  HPI  Nicole Huff is a very pleasant 30 y.o. female patient of Deboraha Sprang with a history of anxiety and depression who presents today for follow up of anxiety and depression.  She was last evaluated two weeks ago for ED follow up for GI symptoms and reports of anxiety and depression since a recent break up with her long time boyfriend. During her last visit she did endorse suicidal thoughts without a plan, she contracted to safety and we decided to proceed with treatment. She was initiated on Zoloft 25 mg and was asked to follow up closely with [place] two weeks later. We also referred her to therapy.  Since her last visit she's feeling slightly improved. She went to the beach with her sister last weekend which helped some. She doesn't believe she's noticed much of a difference since starting sertraline 2 5 mg.  She denies GI upset, nausea, headaches. She received a phone call and plans on calling back to schedule with the therapist.  She does have self inflicting suicidal thoughts that have improved since last visit. An example includes not wearing her seat belt in the car. She denies a plan to overdose on pills.    GAD 7 : Generalized Anxiety Score 03/06/2021 02/20/2021  Nervous, Anxious, on Edge 2 3  Control/stop worrying 3 2  Worry too much - different things 3 2  Trouble relaxing 2 2  Restless 0 2  Easily annoyed or irritable 2 2  Afraid - awful might happen 2 2  Total GAD 7 Score 14 15  Anxiety Difficulty Somewhat difficult Somewhat difficult       Review of Systems  Gastrointestinal: Negative for abdominal pain and nausea.  Psychiatric/Behavioral: The patient is nervous/anxious.        See HPI         Past Medical History:  Diagnosis Date  . Medical history non-contributory     Social History   Socioeconomic History  . Marital status: Single    Spouse name: Not  on file  . Number of children: Not on file  . Years of education: Not on file  . Highest education level: Not on file  Occupational History  . Not on file  Tobacco Use  . Smoking status: Never Smoker  . Smokeless tobacco: Never Used  Substance and Sexual Activity  . Alcohol use: Yes    Comment: rarely  . Drug use: No  . Sexual activity: Yes    Birth control/protection: Implant, Pill  Other Topics Concern  . Not on file  Social History Narrative  . Not on file   Social Determinants of Health   Financial Resource Strain: Not on file  Food Insecurity: Not on file  Transportation Needs: Not on file  Physical Activity: Not on file  Stress: Not on file  Social Connections: Not on file  Intimate Partner Violence: Not on file    Past Surgical History:  Procedure Laterality Date  . NO PAST SURGERIES      Family History  Problem Relation Age of Onset  . Thyroid disease Mother   . Thyroid disease Maternal Grandmother   . Diabetes Maternal Grandfather     No Known Allergies  No current outpatient medications on file prior to visit.   No current facility-administered medications on file prior to visit.    BP (!) 105/58  Pulse 75   Temp 98.5 F (36.9 C) (Temporal)   Ht 5\' 7"  (1.702 m)   Wt 105 lb (47.6 kg)   SpO2 95%   BMI 16.45 kg/m  Objective:   Physical Exam Cardiovascular:     Rate and Rhythm: Normal rate and regular rhythm.  Pulmonary:     Effort: Pulmonary effort is normal.     Breath sounds: Normal breath sounds.  Musculoskeletal:     Cervical back: Neck supple.  Skin:    General: Skin is warm and dry.  Psychiatric:        Mood and Affect: Mood normal.     Comments: Slightly more conversational today, good eye contact.           Assessment & Plan:      This visit occurred during the SARS-CoV-2 public health emergency.  Safety protocols were in place, including screening questions prior to the visit, additional usage of staff PPE, and  extensive cleaning of exam room while observing appropriate contact time as indicated for disinfecting solutions.

## 2021-03-13 DIAGNOSIS — Z01419 Encounter for gynecological examination (general) (routine) without abnormal findings: Secondary | ICD-10-CM | POA: Diagnosis not present

## 2021-03-13 DIAGNOSIS — Z681 Body mass index (BMI) 19 or less, adult: Secondary | ICD-10-CM | POA: Diagnosis not present

## 2021-03-13 DIAGNOSIS — Z113 Encounter for screening for infections with a predominantly sexual mode of transmission: Secondary | ICD-10-CM | POA: Diagnosis not present

## 2021-03-19 ENCOUNTER — Encounter: Payer: Self-pay | Admitting: Primary Care

## 2021-03-19 ENCOUNTER — Other Ambulatory Visit: Payer: Self-pay

## 2021-03-19 ENCOUNTER — Other Ambulatory Visit: Payer: Self-pay | Admitting: Primary Care

## 2021-03-19 ENCOUNTER — Ambulatory Visit: Payer: BC Managed Care – PPO | Admitting: Primary Care

## 2021-03-19 VITALS — BP 114/76 | HR 61 | Temp 97.7°F | Ht 67.0 in | Wt 107.0 lb

## 2021-03-19 DIAGNOSIS — F4323 Adjustment disorder with mixed anxiety and depressed mood: Secondary | ICD-10-CM

## 2021-03-19 MED ORDER — ESCITALOPRAM OXALATE 10 MG PO TABS: 10.0000 mg | ORAL_TABLET | Freq: Every day | ORAL | 0 refills | Status: DC
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
## 2021-03-19 NOTE — Patient Instructions (Signed)
Stop taking sertraline (Zoloft). Start taking escitalopram (Lexapro) 10 mg for anxiety and depression.   Schedule a follow up visit with me for 4 weeks.  It was a pleasure to see you today!

## 2021-03-19 NOTE — Assessment & Plan Note (Signed)
No improvement despite dose increase of Zoloft. Will stop Zoloft, switch to Lexapro 10 mg. She agrees.  She contracts to safety today. She will contact the therapist once her insurance is fixed.   We will plan to follow up in 4 weeks or sooner if needed.

## 2021-03-27 DIAGNOSIS — N9089 Other specified noninflammatory disorders of vulva and perineum: Secondary | ICD-10-CM | POA: Diagnosis not present

## 2021-03-27 DIAGNOSIS — D128 Benign neoplasm of rectum: Secondary | ICD-10-CM | POA: Diagnosis not present

## 2021-04-20 ENCOUNTER — Other Ambulatory Visit: Payer: Self-pay | Admitting: Primary Care

## 2021-04-20 DIAGNOSIS — F4323 Adjustment disorder with mixed anxiety and depressed mood: Secondary | ICD-10-CM

## 2021-04-24 ENCOUNTER — Other Ambulatory Visit: Payer: Self-pay

## 2021-04-24 ENCOUNTER — Ambulatory Visit (INDEPENDENT_AMBULATORY_CARE_PROVIDER_SITE_OTHER): Payer: BC Managed Care – PPO | Admitting: Primary Care

## 2021-04-24 VITALS — BP 108/70 | HR 74 | Temp 97.9°F | Resp 16 | Ht 67.0 in | Wt 108.0 lb

## 2021-04-24 DIAGNOSIS — F4323 Adjustment disorder with mixed anxiety and depressed mood: Secondary | ICD-10-CM | POA: Diagnosis not present

## 2021-04-24 MED ORDER — ESCITALOPRAM OXALATE 10 MG PO TABS: ORAL_TABLET | ORAL | 1 refills | Status: DC

## 2021-04-24 NOTE — Assessment & Plan Note (Signed)
Better on Lexapro 10 mg!! Continue same.  New referral placed for therapy. She will update if she doesn't hear anything within 2 weeks.  Follow up in 3-6 months.

## 2021-04-24 NOTE — Patient Instructions (Signed)
You will be contacted regarding your referral to therapy.  Please let us know if you have not been contacted within two weeks.   Continue Lexapro 10 mg.  Set up a follow up visit for 3-6 months.  It was a pleasure to see you today!

## 2021-04-24 NOTE — Progress Notes (Signed)
Subjective:    Patient ID: Nicole Huff, female    DOB: [birth year], 30 y.o.   MRN: [id number]  HPI  Nicole Huff is a very pleasant 30 y.o. female patient of Deboraha Sprang with a history of anxiety and depression who presents today for follow up of anxiety and depression.  Currently managed on Lexapro 10 mg which was initiated one month ago after no improvement on Zoloft.   Since her last visit she is feeling better! Positive effects include feeling more relaxed at the end of her work day, doesn't feel as "down". She is wanting to get out of the house more, is shopping more.   She continues to struggle with anxiety during stressful situations, when "things don't go my way", becomes irritable. Overall she feels at least 50% better. She denies nausea, headaches, GI upset, SI/HI  She has yet to hear for a therapy appointment. Has sent in the new patient paperwork.    Review of Systems  Gastrointestinal:  Negative for nausea.  Neurological:  Negative for headaches.  Psychiatric/Behavioral:         See HPI        Past Medical History:  Diagnosis Date   Medical history non-contributory     Social History   Socioeconomic History   Marital status: Single    Spouse name: Not on file   Number of children: Not on file   Years of education: Not on file   Highest education level: Not on file  Occupational History   Not on file  Tobacco Use   Smoking status: Never   Smokeless tobacco: Never  Substance and Sexual Activity   Alcohol use: Yes    Comment: rarely   Drug use: No   Sexual activity: Yes    Birth control/protection: Implant, Pill  Other Topics Concern   Not on file  Social History Narrative   Not on file   Social Determinants of Health   Financial Resource Strain: Not on file  Food Insecurity: Not on file  Transportation Needs: Not on file  Physical Activity: Not on file  Stress: Not on file  Social Connections: Not on file  Intimate Partner  Violence: Not on file    Past Surgical History:  Procedure Laterality Date   NO PAST SURGERIES      Family History  Problem Relation Age of Onset   Thyroid disease Mother    Thyroid disease Maternal Grandmother    Diabetes Maternal Grandfather     No Known Allergies  Current Outpatient Medications on File Prior to Visit  Medication Sig Dispense Refill   escitalopram (LEXAPRO) 10 MG tablet TAKE 1 TABLET BY MOUTH EVERY DAY FOR ANXIETY AND DEPRESSION 30 tablet 0   No current facility-administered medications on file prior to visit.    BP 108/70   Pulse 74   Temp 97.9 F (36.6 C)   Resp 16   Ht 5\' 7"  (1.702 m)   Wt 108 lb (49 kg)   SpO2 98%   BMI 16.92 kg/m  Objective:   Physical Exam Cardiovascular:     Rate and Rhythm: Normal rate and regular rhythm.  Pulmonary:     Effort: Pulmonary effort is normal.     Breath sounds: Normal breath sounds.  Musculoskeletal:     Cervical back: Neck supple.  Skin:    General: Skin is warm and dry.  Psychiatric:     Comments: Great eye contact, more conversational, improved mood  Assessment & Plan:      This visit occurred during the SARS-CoV-2 public health emergency.  Safety protocols were in place, including screening questions prior to the visit, additional usage of staff PPE, and extensive cleaning of exam room while observing appropriate contact time as indicated for disinfecting solutions.

## 2021-05-22 ENCOUNTER — Ambulatory Visit (INDEPENDENT_AMBULATORY_CARE_PROVIDER_SITE_OTHER): Payer: BC Managed Care – PPO | Admitting: Psychology

## 2021-05-22 DIAGNOSIS — Z113 Encounter for screening for infections with a predominantly sexual mode of transmission: Secondary | ICD-10-CM | POA: Diagnosis not present

## 2021-05-22 DIAGNOSIS — N760 Acute vaginitis: Secondary | ICD-10-CM | POA: Diagnosis not present

## 2021-05-22 DIAGNOSIS — F4321 Adjustment disorder with depressed mood: Secondary | ICD-10-CM

## 2021-06-04 ENCOUNTER — Ambulatory Visit (INDEPENDENT_AMBULATORY_CARE_PROVIDER_SITE_OTHER): Payer: BC Managed Care – PPO | Admitting: Psychology

## 2021-06-04 DIAGNOSIS — F4321 Adjustment disorder with depressed mood: Secondary | ICD-10-CM

## 2021-06-30 ENCOUNTER — Ambulatory Visit (INDEPENDENT_AMBULATORY_CARE_PROVIDER_SITE_OTHER): Payer: BC Managed Care – PPO | Admitting: Psychology

## 2021-06-30 DIAGNOSIS — F4321 Adjustment disorder with depressed mood: Secondary | ICD-10-CM

## 2021-07-22 ENCOUNTER — Ambulatory Visit (INDEPENDENT_AMBULATORY_CARE_PROVIDER_SITE_OTHER): Payer: BC Managed Care – PPO | Admitting: Psychology

## 2021-07-22 DIAGNOSIS — F4321 Adjustment disorder with depressed mood: Secondary | ICD-10-CM | POA: Diagnosis not present

## 2021-08-06 ENCOUNTER — Ambulatory Visit (INDEPENDENT_AMBULATORY_CARE_PROVIDER_SITE_OTHER): Payer: BC Managed Care – PPO | Admitting: Psychology

## 2021-08-06 DIAGNOSIS — F4321 Adjustment disorder with depressed mood: Secondary | ICD-10-CM

## 2021-08-07 DIAGNOSIS — B349 Viral infection, unspecified: Secondary | ICD-10-CM | POA: Diagnosis not present

## 2021-08-28 ENCOUNTER — Encounter: Payer: Self-pay | Admitting: Primary Care

## 2021-08-28 ENCOUNTER — Other Ambulatory Visit: Payer: Self-pay

## 2021-08-28 ENCOUNTER — Ambulatory Visit (INDEPENDENT_AMBULATORY_CARE_PROVIDER_SITE_OTHER): Payer: BC Managed Care – PPO | Admitting: Primary Care

## 2021-08-28 ENCOUNTER — Ambulatory Visit (INDEPENDENT_AMBULATORY_CARE_PROVIDER_SITE_OTHER): Payer: BC Managed Care – PPO | Admitting: Psychology

## 2021-08-28 DIAGNOSIS — F4321 Adjustment disorder with depressed mood: Secondary | ICD-10-CM | POA: Diagnosis not present

## 2021-08-28 DIAGNOSIS — F4323 Adjustment disorder with mixed anxiety and depressed mood: Secondary | ICD-10-CM | POA: Diagnosis not present

## 2021-08-28 NOTE — Assessment & Plan Note (Signed)
Continues to do well on Lexapro 10 mg, continue same. Continue therapy.  Very glad to see her still doing well!  Follow up in 6 months.

## 2021-08-28 NOTE — Progress Notes (Signed)
Subjective:    Patient ID: Nicole Huff, female    DOB: 1991/07/14, 30 y.o.   MRN: [id number]  HPI  Nicole Huff is a very pleasant 30 y.o. female with a history of anxiety and depression who presents today for follow up of anxiety and depression.  She was last evaluated in July 2022 for follow up after anti-anxiety/depression medication was changed from Zoloft to Lexapro. During this visit she endorsed doing much better on Lexapro 10 mg. Given her improvement we decided to move our follow up visit for four months. She is here today for follow up.  Since her last visit she continues to take Lexapro 10 mg and continues to see benefits. She's able to handle stressful situations better, denies negative thinking, SI/HI. She would like to continue. She is involved in therapy, has an appointment scheduled for today, this has been going well.  She is speaking with her ex-boyfriend again, had dinner with him last night, overall it went well. She is taking things slow. Is learning some things about herself and her relationship with him.   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    Review of Systems  Respiratory:  Negative for shortness of breath.   Cardiovascular:  Negative for chest pain.  Psychiatric/Behavioral:  The patient is not nervous/anxious.        See HPI        Past Medical History:  Diagnosis Date   Medical history non-contributory     Social History   Socioeconomic History   Marital status: Single    Spouse name: Not on file   Number of children: Not on file   Years of education: Not on file   Highest education level: Not on file  Occupational History   Not on file  Tobacco Use   Smoking status: Never   Smokeless tobacco: Never  Substance and Sexual Activity   Alcohol use: Yes    Comment: rarely   Drug use: No   Sexual activity: Yes    Birth  control/protection: Implant, Pill  Other Topics Concern   Not on file  Social History Narrative   Not on file   Social Determinants of Health   Financial Resource Strain: Not on file  Food Insecurity: Not on file  Transportation Needs: Not on file  Physical Activity: Not on file  Stress: Not on file  Social Connections: Not on file  Intimate Partner Violence: Not on file    Past Surgical History:  Procedure Laterality Date   NO PAST SURGERIES      Family History  Problem Relation Age of Onset   Thyroid disease Mother    Thyroid disease Maternal Grandmother    Diabetes Maternal Grandfather     No Known Allergies  Current Outpatient Medications on File Prior to Visit  Medication Sig Dispense Refill   escitalopram (LEXAPRO) 10 MG tablet TAKE 1 TABLET BY MOUTH EVERY DAY FOR ANXIETY AND DEPRESSION 90 tablet 1   No current facility-administered medications on file prior to visit.    BP 110/68   Pulse 66   Temp 98.7 F (37.1 C) (Temporal)   Ht 5\' 7"  (1.702 m)   Wt 112 lb (50.8 kg)   SpO2 98%   BMI 17.54 kg/m  Objective:   Physical Exam Cardiovascular:     Rate and Rhythm: Normal rate and regular rhythm.  Pulmonary:     Effort: Pulmonary effort is normal.     Breath sounds: Normal breath sounds.  Musculoskeletal:     Cervical back: Neck supple.  Skin:    General: Skin is warm and dry.  Psychiatric:        Mood and Affect: Mood normal.     Comments: Great mood today, good eye contact, very conversational           Assessment & Plan:      This visit occurred during the SARS-CoV-2 public health emergency.  Safety protocols were in place, including screening questions prior to the visit, additional usage of staff PPE, and extensive cleaning of exam room while observing appropriate contact time as indicated for disinfecting solutions.

## 2021-08-28 NOTE — Patient Instructions (Addendum)
Continue Lexapro 10 mg daily.  We will see you back in 6 months.  It was a pleasure to see you today!

## 2021-09-25 ENCOUNTER — Ambulatory Visit (INDEPENDENT_AMBULATORY_CARE_PROVIDER_SITE_OTHER): Payer: BC Managed Care – PPO | Admitting: Psychology

## 2021-09-25 DIAGNOSIS — F4321 Adjustment disorder with depressed mood: Secondary | ICD-10-CM | POA: Diagnosis not present

## 2021-10-16 ENCOUNTER — Ambulatory Visit: Payer: BC Managed Care – PPO | Admitting: Psychology

## 2021-11-12 ENCOUNTER — Ambulatory Visit: Payer: BC Managed Care – PPO | Admitting: Nurse Practitioner

## 2021-11-12 ENCOUNTER — Other Ambulatory Visit: Payer: Self-pay

## 2021-11-12 VITALS — BP 102/66 | HR 100 | Temp 98.3°F | Resp 12 | Ht 67.0 in | Wt 112.2 lb

## 2021-11-12 DIAGNOSIS — R1013 Epigastric pain: Secondary | ICD-10-CM | POA: Diagnosis not present

## 2021-11-12 DIAGNOSIS — R197 Diarrhea, unspecified: Secondary | ICD-10-CM

## 2021-11-12 DIAGNOSIS — R112 Nausea with vomiting, unspecified: Secondary | ICD-10-CM

## 2021-11-12 LAB — COMPREHENSIVE METABOLIC PANEL
ALT: 23 U/L (ref 0–35)
AST: 27 U/L (ref 0–37)
Albumin: 4.6 g/dL (ref 3.5–5.2)
Alkaline Phosphatase: 54 U/L (ref 39–117)
BUN: 15 mg/dL (ref 6–23)
CO2: 28 mEq/L (ref 19–32)
Calcium: 9.3 mg/dL (ref 8.4–10.5)
Chloride: 104 mEq/L (ref 96–112)
Creatinine, Ser: 0.74 mg/dL (ref 0.40–1.20)
GFR: 110.24 mL/min (ref >60.00)
Glucose, Bld: 83 mg/dL (ref 70–99)
Potassium: 4.2 mEq/L (ref 3.5–5.1)
Sodium: 138 mEq/L (ref 135–145)
Total Bilirubin: 0.4 mg/dL (ref 0.2–1.2)
Total Protein: 7.4 g/dL (ref 6.0–8.3)

## 2021-11-12 LAB — CBC
HCT: 40.7 % (ref 36.0–46.0)
Hemoglobin: 13.5 g/dL (ref 12.0–15.0)
MCHC: 33.2 g/dL (ref 30.0–36.0)
MCV: 85.3 fl (ref 78.0–100.0)
Platelets: 173 10*3/uL (ref 150.0–400.0)
RBC: 4.77 Mil/uL (ref 3.87–5.11)
RDW: 13.4 % (ref 11.5–15.5)
WBC: 3.5 10*3/uL — ABNORMAL LOW (ref 4.0–10.5)

## 2021-11-12 LAB — POCT URINE PREGNANCY: Preg Test, Ur: NEGATIVE

## 2021-11-12 LAB — H. PYLORI ANTIBODY, IGG: H Pylori IgG: NEGATIVE

## 2021-11-12 LAB — LIPASE: Lipase: 13 U/L (ref 11.0–59.0)

## 2021-11-12 MED ORDER — OMEPRAZOLE 20 MG PO CPDR: 20.0000 mg | DELAYED_RELEASE_CAPSULE | Freq: Every day | ORAL | 0 refills | Status: DC

## 2021-11-12 NOTE — Patient Instructions (Signed)
Nice to see you today I will reach out when I have the labs back Avoid NSAIDs and alcohol for the time being I sent in some stomach medicines to your pharmacy Follow up if no improvement in your symptoms or they get worse.

## 2021-11-12 NOTE — Assessment & Plan Note (Signed)
.    Patient on omeprazole once daily.  States that Pepto and Tums helped.  Has had a history of this with GI cocktail being beneficial in ED pending lab results of this is not beneficial-, consider Carafate use.

## 2021-11-12 NOTE — Assessment & Plan Note (Signed)
History of the same.  Has been evaluated in ED before.  We will place her on a PPI and draw lab work to make sure she is not experiencing pancreatitis, gallstones, H. pylori.  Continue to monitor

## 2021-11-12 NOTE — Progress Notes (Signed)
Acute Office Visit  Subjective:    Patient ID: Nicole Huff, female    DOB: June 19, 1991, 31 y.o.   MRN: [id number]  Chief Complaint  Patient presents with   Diarrhea    Started on 11/09/21-had diarrhea suddenly and vomited once on that day, felt weak/drained after. On 1/17 and 11/11/21 felt weak but got some better during the day on 11/11/21. Started having headache, upper abdominal pain/discomfort last night. Feels better today but still has a quezy/gargling sensation in the stomach. Has not had any food today.     Patient is in today for Stomach issues  States started on 11/09/2021. On her way to work she had a queasy light headed feeling. States that she felt nausous and had diarrhea along with vomiting. States that she went home early and laid down. Did not go to work on Tuesday (the next day). Went to work Wednesday and was told that she looked drianed. She was able to eat lunch and felt better. Last night she started having upper abdominal pain. She googled the sympotms and raised the head of the bed without help. State that she went to work today. Approx around 1030 pain started wearing off, fetal position helped with stomach pain. The pain is described as a sharper pain that is now dull.  States that she does use advil when her period comes and a headache on occ once a week. States that she rarely drinks alcohol. Decreased appetite.  Did take tums and pepto on Monday with relief   Sick contacts: sister did have the flu last week and she did drop her stuff off on Thursday.    Past Medical History:  Diagnosis Date   Medical history non-contributory     Past Surgical History:  Procedure Laterality Date   NO PAST SURGERIES      Family History  Problem Relation Age of Onset   Thyroid disease Mother    Thyroid disease Maternal Grandmother    Diabetes Maternal Grandfather     Social History   Socioeconomic History   Marital status: Single    Spouse name: Not on file    Number of children: Not on file   Years of education: Not on file   Highest education level: Not on file  Occupational History   Not on file  Tobacco Use   Smoking status: Never   Smokeless tobacco: Never  Substance and Sexual Activity   Alcohol use: Yes    Comment: rarely   Drug use: No   Sexual activity: Yes    Birth control/protection: Implant, Pill  Other Topics Concern   Not on file  Social History Narrative   Not on file   Social Determinants of Health   Financial Resource Strain: Not on file  Food Insecurity: Not on file  Transportation Needs: Not on file  Physical Activity: Not on file  Stress: Not on file  Social Connections: Not on file  Intimate Partner Violence: Not on file    Outpatient Medications Prior to Visit  Medication Sig Dispense Refill   escitalopram (LEXAPRO) 10 MG tablet TAKE 1 TABLET BY MOUTH EVERY DAY FOR ANXIETY AND DEPRESSION 90 tablet 1   No facility-administered medications prior to visit.    No Known Allergies  Review of Systems  Constitutional:  Negative for chills, fatigue and fever.  Respiratory:  Negative for cough and shortness of breath.   Cardiovascular:  Negative for chest pain.  Gastrointestinal:  Positive for abdominal pain, diarrhea, nausea  and vomiting. Negative for constipation.       3-4 days she has a BM traditionally   Genitourinary:  Negative for dysuria, vaginal bleeding, vaginal discharge and vaginal pain.  Neurological:  Negative for dizziness, weakness, light-headedness and headaches.      Objective:    Physical Exam Vitals and nursing note reviewed.  Constitutional:      Appearance: Normal appearance.  HENT:     Mouth/Throat:     Mouth: Mucous membranes are moist.     Pharynx: Oropharynx is clear.  Cardiovascular:     Rate and Rhythm: Tachycardia present.     Heart sounds: Normal heart sounds.  Pulmonary:     Effort: Pulmonary effort is normal.     Breath sounds: Normal breath sounds.  Abdominal:      General: Bowel sounds are normal.     Palpations: Abdomen is soft.     Tenderness: There is abdominal tenderness in the epigastric area.    Lymphadenopathy:     Cervical: No cervical adenopathy.  Neurological:     Mental Status: She is alert.    BP 102/66    Pulse 100    Temp 98.3 F (36.8 C)    Resp 12    Ht 5\' 7"  (1.702 m)    Wt 112 lb 4 oz (50.9 kg)    LMP 10/28/2021    SpO2 (!) 67%    BMI 17.58 kg/m  Wt Readings from Last 3 Encounters:  11/12/21 112 lb 4 oz (50.9 kg)  08/28/21 112 lb (50.8 kg)  04/24/21 108 lb (49 kg)    Health Maintenance Due  Topic Date Due   TETANUS/TDAP  Never done    There are no preventive care reminders to display for this patient.   Lab Results  Component Value Date   TSH 1.26 06/20/2020   Lab Results  Component Value Date   WBC 9.0 02/16/2021   HGB 14.6 02/16/2021   HCT 43.0 02/16/2021   MCV 87.1 02/16/2021   PLT 240 02/16/2021   Lab Results  Component Value Date   NA 139 02/16/2021   K 4.2 02/16/2021   CO2 24 02/16/2021   GLUCOSE 100 (H) 02/16/2021   BUN 11 02/16/2021   CREATININE 0.60 02/16/2021   BILITOT 1.0 02/16/2021   ALKPHOS 52 02/16/2021   AST 20 02/16/2021   ALT 17 02/16/2021   PROT 8.6 (H) 02/16/2021   ALBUMIN 4.9 02/16/2021   CALCIUM 9.8 02/16/2021   ANIONGAP 11 02/16/2021   GFR 90.98 06/20/2020   Lab Results  Component Value Date   CHOL 198 06/20/2020   Lab Results  Component Value Date   HDL 53.30 06/20/2020   Lab Results  Component Value Date   LDLCALC 130 (H) 06/20/2020   Lab Results  Component Value Date   TRIG 70.0 06/20/2020   Lab Results  Component Value Date   CHOLHDL 4 06/20/2020   No results found for: HGBA1C     Assessment & Plan:   Problem List Items Addressed This Visit       Digestive   Nausea vomiting and diarrhea    History of the same.  Has been evaluated in ED before.  We will place her on a PPI and draw lab work to make sure she is not experiencing pancreatitis,  gallstones, H. pylori.  Continue to monitor      Relevant Orders   CBC   Comprehensive metabolic panel   POCT urine pregnancy (Completed)  Other   Epigastric pain - Primary    .  Patient on omeprazole once daily.  States that Pepto and Tums helped.  Has had a history of this with GI cocktail being beneficial in ED pending lab results of this is not beneficial-, consider Carafate use.      Relevant Medications   omeprazole (PRILOSEC) 20 MG capsule   Other Relevant Orders   CBC   Lipase   Comprehensive metabolic panel   H. pylori antibody, IgG   POCT urine pregnancy (Completed)     Meds ordered this encounter  Medications   omeprazole (PRILOSEC) 20 MG capsule    Sig: Take 1 capsule (20 mg total) by mouth daily.    Dispense:  30 capsule    Refill:  0    Order Specific Question:   Supervising Provider    Answer:   Roxy MannsOWER, MARNE A [[id number]   This visit occurred during the SARS-CoV-2 public health emergency.  Safety protocols were in place, including screening questions prior to the visit, additional usage of staff PPE, and extensive cleaning of exam room while observing appropriate contact time as indicated for disinfecting solutions.    Audria NineMatt March Joos, NP

## 2021-11-13 ENCOUNTER — Ambulatory Visit (INDEPENDENT_AMBULATORY_CARE_PROVIDER_SITE_OTHER): Payer: BC Managed Care – PPO | Admitting: Psychology

## 2021-11-13 DIAGNOSIS — F4321 Adjustment disorder with depressed mood: Secondary | ICD-10-CM | POA: Diagnosis not present

## 2021-11-13 NOTE — Progress Notes (Signed)
[facility] Counselor/Therapist Progress Note  Patient ID: Nicole Huff, MRN: [id number],    Date: 11/13/2021  Time Spent: 50 mins  Treatment Type: Individual Therapy  Reported Symptoms: Pt presents for today's session, via webex video, due to virus outbreak.  Pt grants consent for session, stating she is in her home with no one else present.  I shared with pt that I am in my office at home with no one else here either.  Mental Status Exam: Appearance:  Casual     Behavior: Appropriate  Motor: Normal  Speech/Language:  Clear and Coherent  Affect: Appropriate  Mood: normal  Thought process: normal  Thought content:   WNL  Sensory/Perceptual disturbances:   WNL  Orientation: oriented to person, place, and time/date  Attention: Good  Concentration: Good  Memory: WNL  Fund of knowledge:  Good  Insight:   Good  Judgment:  Good  Impulse Control: Good   Risk Assessment: Danger to Self:  No Self-injurious Behavior: No Danger to Others: No Duty to Warn:no Physical Aggression / Violence:No  Access to Firearms a concern: No  Gang Involvement:No   Subjective: Pt shares that school is going well so far.  She has one online class and one in person class Industrial/product designer).  Pt was not feeling well yesterday but is better today.  Pt shares that Harrison Mons is working hard and trying to save money.  They are not arguing anymore.  They spent time together over the holidays and pt enjoyed that time.  She is working M-Th at the [employer].  Pt shares that she and Harrison Mons are planning to go on a cruise together in April and pt is excited about that trip.  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  Pt shares that she has been sleeping very well at night.  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  Pt shares her self care activities have included doing her nails, cleaning her home, spending time with Harrison Mons, sleeping in on the weekends, enjoys driving in her car, listening to music, etc.  Encouraged pt to continue with her self care activities and we will meet in one month for a follow up session.  Interventions: Cognitive Behavioral Therapy  Diagnosis:Adjustment disorder with depressed mood  Plan: Treatment Plan Strengths/Abilities:  Intelligent, Intuitive, Willing to participate in therapy Treatment Preferences:  Outpatient Individual Therapy Statement of Needs:  Patient is to use CBT, mindfulness and coping skills to help manage and/or decrease symptoms associated with their diagnosis. Symptoms:  Depressed/Irritable mood, worry, social withdrawal Problems Addressed:  Depressive thoughts, Sadness, Sleep issues, etc. Long Term Goals:  Pt to reduce overall level, frequency, and intensity of the feelings of depression as evidenced by decreased irritability, negative self talk, and helpless feelings from 6 to 7 days/week to 0 to 1 days/week, per client report, for at least 3 consecutive months.  Progress: 30% Short Term Goals:  Pt to verbally express understanding of the relationship between feelings of depression and their impact on thinking patterns and behaviors.  Pt to verbalize an understanding of the role that distorted thinking plays in creating fears, excessive worry, and ruminations.  Progress: 30% Target Date:  11/13/2022 Frequency:  Bi-weekly Modality:  Cognitive Behavioral Therapy Interventions by Therapist:  Therapist will use CBT, Mindfulness exercises, Coping skills and Referrals, as needed by client. Client has verbally approved this treatment plan.  Karie Kirks, [facility]

## 2021-11-22 ENCOUNTER — Other Ambulatory Visit: Payer: Self-pay | Admitting: Primary Care

## 2021-11-22 DIAGNOSIS — F4323 Adjustment disorder with mixed anxiety and depressed mood: Secondary | ICD-10-CM

## 2021-12-11 ENCOUNTER — Ambulatory Visit (INDEPENDENT_AMBULATORY_CARE_PROVIDER_SITE_OTHER): Payer: BC Managed Care – PPO | Admitting: Psychology

## 2021-12-11 DIAGNOSIS — F4321 Adjustment disorder with depressed mood: Secondary | ICD-10-CM

## 2021-12-11 NOTE — Progress Notes (Signed)
[facility] Counselor/Therapist Progress Note  Patient ID: Nicole Huff, MRN: [id number],    Date: 12/11/2021  Time Spent: 50 mins  Treatment Type: Individual Therapy  Reported Symptoms: Pt presents for today's session, via webex video, due to virus outbreak.  Pt grants consent for session, stating she is in her home with no one else present.  I shared with pt that I am in my office at home with no one else here either.  Mental Status Exam: Appearance:  Casual     Behavior: Appropriate  Motor: Normal  Speech/Language:  Clear and Coherent  Affect: Appropriate  Mood: normal  Thought process: normal  Thought content:   WNL  Sensory/Perceptual disturbances:   WNL  Orientation: oriented to person, place, and time/date  Attention: Good  Concentration: Good  Memory: WNL  Fund of knowledge:  Good  Insight:   Good  Judgment:  Good  Impulse Control: Good   Risk Assessment: Danger to Self:  No Self-injurious Behavior: No Danger to Others: No Duty to Warn:no Physical Aggression / Violence:No  Access to Firearms a concern: No  Gang Involvement:No   Subjective: Pt shares that Shokan broke up with her shortly after our last session because "he thought I was cheating on him.  I haven't done anything wrong."  Pt shares, "I feel like he is just trying to keep me on a string."  Pt shares that Novinger treating her this way makes her feel like she is not important to him.  Encouraged pt to pay more attention to what Keenan Bachelor is doing rather than to what he is saying.  She is able to say that Keenan Bachelor is not indicating that he truly cares for her.  Pt has not really told anyone what is going on with her and Keenan Bachelor but she feels like they can see that she is not happy right now.  Pt shares she is very sad but denies any SI, plan, or intent.  Pt shares that she has reached out to the other (married) guy a couple of weeks ago and "we have been talking a lot."  Pt shares she had not talked with  him since last June when he asked her to give him space to work on his marriage, until 2 wks ago.  Their first conversation was about an hour and a half and, "Keenan Bachelor has never given me time like that."  Pt shares that this guy has been talking more recently about the struggles in his marriage and pt shares this is new for him.  Pt shares she is still in school and has gotten behind because of all of these situations swirling around inside of her.  Encouraged pt to continue catching up on her school work so she can still have her educational options in front of her and to engage in at least one self care activity every day between now and our follow up session next week.  Interventions: Cognitive Behavioral Therapy  Diagnosis:Adjustment disorder with depressed mood  Plan: Treatment Plan Strengths/Abilities:  Intelligent, Intuitive, Willing to participate in therapy Treatment Preferences:  Outpatient Individual Therapy Statement of Needs:  Patient is to use CBT, mindfulness and coping skills to help manage and/or decrease symptoms associated with their diagnosis. Symptoms:  Depressed/Irritable mood, worry, social withdrawal Problems Addressed:  Depressive thoughts, Sadness, Sleep issues, etc. Long Term Goals:  Pt to reduce overall level, frequency, and intensity of the feelings of depression as evidenced by decreased irritability, negative self talk, and helpless  feelings from 6 to 7 days/week to 0 to 1 days/week, per client report, for at least 3 consecutive months.  Progress: 30% Short Term Goals:  Pt to verbally express understanding of the relationship between feelings of depression and their impact on thinking patterns and behaviors.  Pt to verbalize an understanding of the role that distorted thinking plays in creating fears, excessive worry, and ruminations.  Progress: 30% Target Date:  11/13/2022 Frequency:  Bi-weekly Modality:  Cognitive Behavioral Therapy Interventions by Therapist:  Therapist  will use CBT, Mindfulness exercises, Coping skills and Referrals, as needed by client. Client has verbally approved this treatment plan.  Ivan Anchors, [place], [facility]

## 2021-12-18 ENCOUNTER — Ambulatory Visit (INDEPENDENT_AMBULATORY_CARE_PROVIDER_SITE_OTHER): Payer: BC Managed Care – PPO | Admitting: Psychology

## 2021-12-18 DIAGNOSIS — F4321 Adjustment disorder with depressed mood: Secondary | ICD-10-CM | POA: Diagnosis not present

## 2021-12-18 NOTE — Progress Notes (Signed)
[facility] Counselor/Therapist Progress Note  Patient ID: Nicole Huff, MRN: [id number],    Date: 12/18/2021  Time Spent: 50 mins  Treatment Type: Individual Therapy  Reported Symptoms: Pt presents for today's session, via webex video, due to virus outbreak.  Pt grants consent for session, stating she is in her home with no one else present.  I shared with pt that I am in my office at home with no one else here either.  Mental Status Exam: Appearance:  Casual     Behavior: Appropriate  Motor: Normal  Speech/Language:  Clear and Coherent  Affect: Appropriate  Mood: normal  Thought process: normal  Thought content:   WNL  Sensory/Perceptual disturbances:   WNL  Orientation: oriented to person, place, and time/date  Attention: Good  Concentration: Good  Memory: WNL  Fund of knowledge:  Good  Insight:   Good  Judgment:  Good  Impulse Control: Good   Risk Assessment: Danger to Self:  No Self-injurious Behavior: No Danger to Others: No Duty to Warn:no Physical Aggression / Violence:No  Access to Firearms a concern: No  Gang Involvement:No   Subjective: Pt shares that "last weekend was a little rough for me.  Harrison Mons was still ignoring me and we finally ended up talking on Monday and he told me that he just couldn't give me the kind of life I wanted.  It hurt me that he was willing to let me go.  The next day (Tuesday) he texted me good morning, talked with me throughout the day just like normal, and we talked again that night.  I am not sure what is going on with him."  Pt shares that Harrison Mons told her he did not want to go on the cruise that they are scheduled to go on in April, even though they have already paid for the cruise and the flights.  She is not sure what she might do, if he does not go.  Pt is still focusing on trying to talk Harrison Mons into going with her; "we have 45 days before we go, so maybe I can talk him into it."  Asked pt if she has thought about how  long she is willing to continue to participate in this up and down relationship with Harrison Mons; "last week, when he was not talking to me, I had talked myself into the idea that I could stay away from him.  And then he reached back out to me and here I am again."  Pt shares that her sister is also "in a toxic relationship."  She feels badly for her sister and recognizes that that relationship is very poor.  Pt shares that she and Harrison Mons have some significant financial entanglements; they each have their own bank accounts and they have a shared account as well.  Pt shares she thinks about her future and Harrison Mons is always in it when she thinks about the future.  Harrison Mons does not want children and pt is not sure whether or not she wants them at this age; "I wanted to have kids when I was 38-24 yo, not at almost 6 yo."  Harrison Mons is 31 yo and has never been married; his next longest relationship has been 8 months.  Pt shares that she has is catching up in school; she only only has one more week of one of her classes.  Her second job will start up next week as well.  Pt shares she has not been doing a lot of  self care activities since our last session.  Encouraged pt to continue to be intentional about her self care activities between now and our follow up session in 3 wks.  Interventions: Cognitive Behavioral Therapy  Diagnosis:Adjustment disorder with depressed mood  Plan: Treatment Plan Strengths/Abilities:  Intelligent, Intuitive, Willing to participate in therapy Treatment Preferences:  Outpatient Individual Therapy Statement of Needs:  Patient is to use CBT, mindfulness and coping skills to help manage and/or decrease symptoms associated with their diagnosis. Symptoms:  Depressed/Irritable mood, worry, social withdrawal Problems Addressed:  Depressive thoughts, Sadness, Sleep issues, etc. Long Term Goals:  Pt to reduce overall level, frequency, and intensity of the feelings of depression as evidenced by decreased  irritability, negative self talk, and helpless feelings from 6 to 7 days/week to 0 to 1 days/week, per client report, for at least 3 consecutive months.  Progress: 30% Short Term Goals:  Pt to verbally express understanding of the relationship between feelings of depression and their impact on thinking patterns and behaviors.  Pt to verbalize an understanding of the role that distorted thinking plays in creating fears, excessive worry, and ruminations.  Progress: 30% Target Date:  11/13/2022 Frequency:  Bi-weekly Modality:  Cognitive Behavioral Therapy Interventions by Therapist:  Therapist will use CBT, Mindfulness exercises, Coping skills and Referrals, as needed by client. Client has verbally approved this treatment plan.  Karie Kirks, [facility]

## 2022-01-08 ENCOUNTER — Ambulatory Visit: Payer: BC Managed Care – PPO | Admitting: Psychology

## 2022-02-26 ENCOUNTER — Telehealth: Payer: Self-pay

## 2022-02-26 ENCOUNTER — Telehealth: Payer: Self-pay | Admitting: Primary Care

## 2022-02-26 ENCOUNTER — Encounter: Payer: Self-pay | Admitting: Primary Care

## 2022-02-26 ENCOUNTER — Ambulatory Visit: Payer: BC Managed Care – PPO | Admitting: Primary Care

## 2022-02-26 DIAGNOSIS — F4323 Adjustment disorder with mixed anxiety and depressed mood: Secondary | ICD-10-CM

## 2022-02-26 MED ORDER — ESCITALOPRAM OXALATE 10 MG PO TABS: 10.0000 mg | ORAL_TABLET | Freq: Every day | ORAL | 0 refills | Status: DC

## 2022-02-26 NOTE — Telephone Encounter (Signed)
Noted, we will try to reach patient. ?

## 2022-02-26 NOTE — Telephone Encounter (Signed)
Forwarding

## 2022-02-26 NOTE — Telephone Encounter (Signed)
Sent patient a mychart message regarding this

## 2022-02-26 NOTE — Telephone Encounter (Signed)
[facility] Night - Client ?Nonclinical Telephone Record  ?[facility]? ?Client [facility] Night - Client ?Client Site [place] ?Provider Alma Friendly - NP ?Contact Type Call ?Who Is Calling Patient / Member / Family / Caregiver ?Caller Name Ronda Lubarsky ?Caller Phone Number [phone] ?Patient Name Nicole Huff ?Patient DOB September 08, 1991 ?Call Type Message Only Information Provided ?Reason for Call Request to Reschedule Office Appointment ?Initial Comment Caller states to reschedule an appointment. ?Patient request to speak to RN No ?Additional Comment Provided office hours. ?Disp. Time Disposition Final User ?02/26/2022 7:52:33 AM General Information Provided Yes Atha Starks Michelene Gardener ?Call Closed By: Baker Janus ?Transaction Date/Time: 02/26/2022 7:50:36 AM (ET ?

## 2022-02-26 NOTE — Telephone Encounter (Signed)
?  Encourage patient to contact the pharmacy for refills or they can request refills through [facility] ? ?LAST APPOINTMENT DATE:  Please schedule appointment if longer than 1 year ? ?NEXT APPOINTMENT DATE: ? ?MEDICATION:escitalopram (LEXAPRO) 10 MG tablet ? ? ?Is the patient out of medication?  ? ?PHARMACY: ?[id number] - [address], [place] - [phone] Jerilynn Mages Phone:  [phone]  ?Fax:  [phone]  ?  ? ? ?Let patient know to contact pharmacy at the end of the day to make sure medication is ready. ? ?Please notify patient to allow 48-72 hours to process ? ?CLINICAL FILLS OUT ALL BELOW:  ? ?LAST REFILL: ? ?QTY: ? ?REFILL DATE: ? ? ? ?OTHER COMMENTS:  ? ? ?Okay for refill? ? ?Please advise ? ? ?  ?

## 2022-02-26 NOTE — Telephone Encounter (Signed)
Unable to reach pt by phone to verify pt wants to reschedule appt with Gentry Fitz NP 02/26/22 at 2 PM. Left v/m for pt to cb ASAP about appt. Sending note to lsc support, Gentry Fitz NP and Joellen CMA.and sending teams to [place] who is working with Anda Kraft today. ?

## 2022-03-02 NOTE — Telephone Encounter (Signed)
error 

## 2022-03-19 ENCOUNTER — Ambulatory Visit (INDEPENDENT_AMBULATORY_CARE_PROVIDER_SITE_OTHER): Payer: BC Managed Care – PPO | Admitting: Psychology

## 2022-03-19 ENCOUNTER — Ambulatory Visit: Payer: BC Managed Care – PPO | Admitting: Primary Care

## 2022-03-19 DIAGNOSIS — F4321 Adjustment disorder with depressed mood: Secondary | ICD-10-CM | POA: Diagnosis not present

## 2022-03-19 NOTE — Progress Notes (Signed)
[facility] Counselor/Therapist Progress Note  Patient ID: CYRIAH WEINAND, MRN: [id number],    Date: 03/19/2022  Time Spent: 50 mins  Treatment Type: Individual Therapy  Reported Symptoms: Pt presents for today's session, via webex video.  Pt grants consent for session, stating she is in her car with no one else present.  I shared with pt that I am in my office at home with no one else here either.  Mental Status Exam: Appearance:  Casual     Behavior: Appropriate  Motor: Normal  Speech/Language:  Clear and Coherent  Affect: Appropriate  Mood: normal  Thought process: normal  Thought content:   WNL  Sensory/Perceptual disturbances:   WNL  Orientation: oriented to person, place, and time/date  Attention: Good  Concentration: Good  Memory: WNL  Fund of knowledge:  Good  Insight:   Good  Judgment:  Good  Impulse Control: Good   Risk Assessment: Danger to Self:  No Self-injurious Behavior: No Danger to Others: No Duty to Warn:no Physical Aggression / Violence:No  Access to Firearms a concern: No  Gang Involvement:No   Subjective: Pt shares that "things have been OK; Blake and I are still not together.  (They talked to each other everyday but they have not seen each other; "I am very content with where we are right now with just being friends.") I have been seen a new guy, Roderic Palau, but I am not likely to stay with him.  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  She worked their right out of [employer] school.  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  Her mom's husband had prostate surgery for his cancer and has recovered well; he is going through some radiation treatment for a short time.  She shares her mom and dad  are doing well.  Pt went on the cruise with her sister instead of Keenan Bachelor.  They had a great time.  Pt shares that she has been able to see herself maintaining some distance from [place].  Pt shares she is starting an [place] class for [employer] school; she is hoping to apply to [employer] soon after summer school.  Encouraged pt to continue to be intentional about her self care activities.  She will call the office to reschedule after she starts her new job.  Interventions: Cognitive Behavioral Therapy  Diagnosis:Adjustment disorder with depressed mood  Plan: Treatment Plan Strengths/Abilities:  Intelligent, Intuitive, Willing to participate in therapy Treatment Preferences:  Outpatient Individual Therapy Statement of Needs:  Patient is to use CBT, mindfulness and coping skills to help manage and/or decrease symptoms associated with their diagnosis. Symptoms:  Depressed/Irritable mood, worry, social withdrawal Problems Addressed:  Depressive thoughts, Sadness, Sleep issues, etc. Long Term Goals:  Pt to reduce overall level, frequency, and intensity of the feelings of depression as evidenced by decreased irritability, negative self talk, and helpless feelings from 6 to 7 days/week to 0 to 1 days/week, per client report, for at least 3 consecutive months.  Progress: 30% Short Term Goals:  Pt to verbally express understanding of the relationship between feelings of depression and their impact on thinking patterns and behaviors.  Pt to verbalize an understanding of the  role that distorted thinking plays in creating fears, excessive worry, and ruminations.  Progress: 30% Target Date:  11/13/2022 Frequency:  Bi-weekly Modality:  Cognitive Behavioral Therapy Interventions by Therapist:  Therapist will use CBT, Mindfulness exercises, Coping skills and Referrals, as needed by client. Client has verbally approved this treatment plan.  Ivan Anchors, [facility]

## 2022-04-12 ENCOUNTER — Telehealth: Payer: Self-pay | Admitting: Primary Care

## 2022-04-12 DIAGNOSIS — F4323 Adjustment disorder with mixed anxiety and depressed mood: Secondary | ICD-10-CM

## 2022-04-12 NOTE — Telephone Encounter (Signed)
Patient is calling in asking for an early refill for a 3 month supply as her insurance will run out at the end of this month. Aly says she doesn't run out of medication until mid July. Insurance will only cover 3 month supply, and wont be able to afford it when she it runs out.

## 2022-04-13 ENCOUNTER — Other Ambulatory Visit: Payer: Self-pay | Admitting: Primary Care

## 2022-04-13 DIAGNOSIS — F4323 Adjustment disorder with mixed anxiety and depressed mood: Secondary | ICD-10-CM

## 2022-04-13 MED ORDER — ESCITALOPRAM OXALATE 10 MG PO TABS: 10.0000 mg | ORAL_TABLET | Freq: Every day | ORAL | 0 refills | Status: DC

## 2022-04-13 NOTE — Telephone Encounter (Signed)
Please see Mychart message and refill done by PCP today.   Routing to MA to follow-up with pt to notify medication has been ordered.

## 2022-04-13 NOTE — Telephone Encounter (Signed)
Left message to return call to our office.  

## 2022-06-07 IMAGING — CT CT ABD-PELV W/ CM
2 of 4 series · 16 of 46 positions shown, 18 images · IV contrast (omnipaque)
Comparison: Ultrasound pelvis 06/20/2015 report

CLINICAL DATA: Abdominal pain.  Non bilious vomiting.

EXAM:
CT ABDOMEN AND PELVIS WITH CONTRAST
TECHNIQUE: Multidetector CT imaging of the abdomen and pelvis was performed
using the standard protocol following bolus administration of
intravenous contrast.
CONTRAST:  100mL OMNIPAQUE IOHEXOL 300 MG/ML  SOLN

[Series 2: axial st · axial · 0.67mm/px · z∈[-500,-116]mm · 13 of 87 slices shown, 15 images]
[im 5/87  soft-tissue]
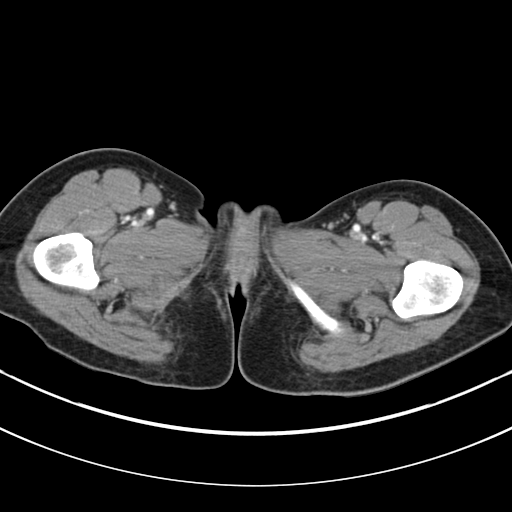
[im 5/87  bone]
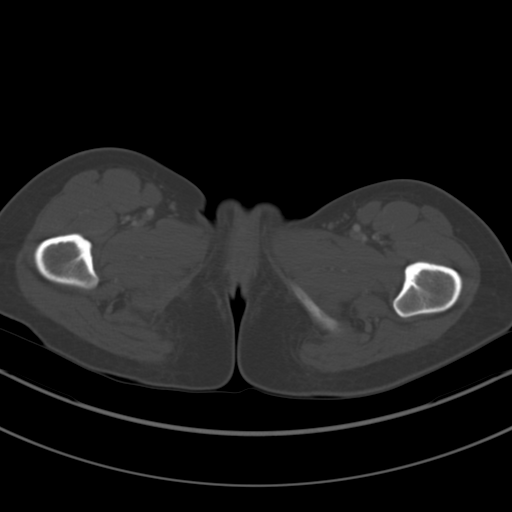
[im 14/87  soft-tissue]
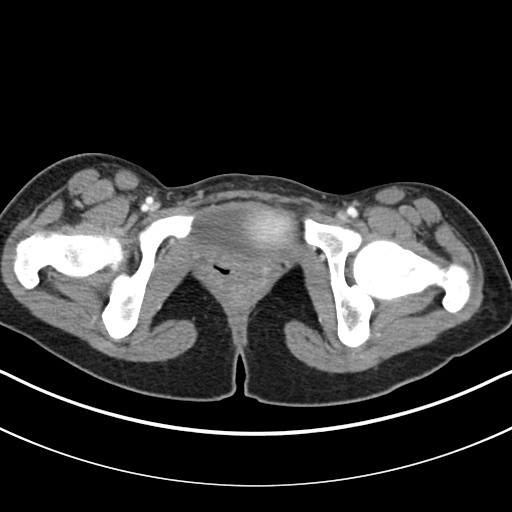
[im 19/87  soft-tissue]
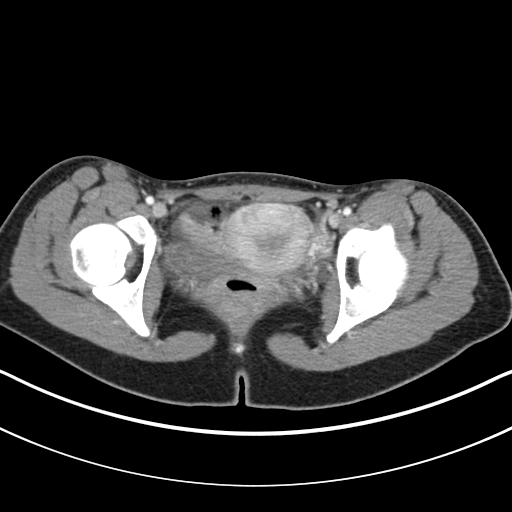
[im 23/87  soft-tissue]
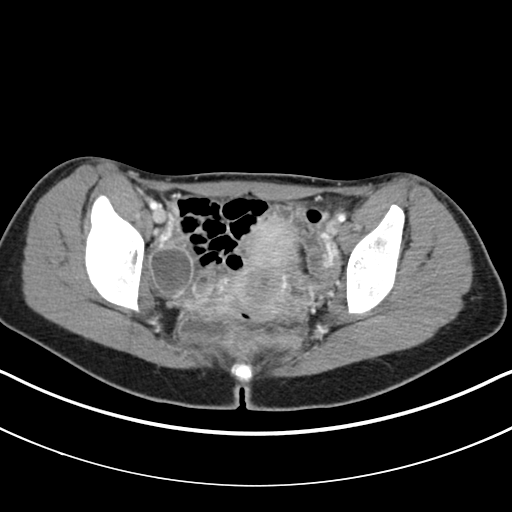
[im 32/87  soft-tissue]
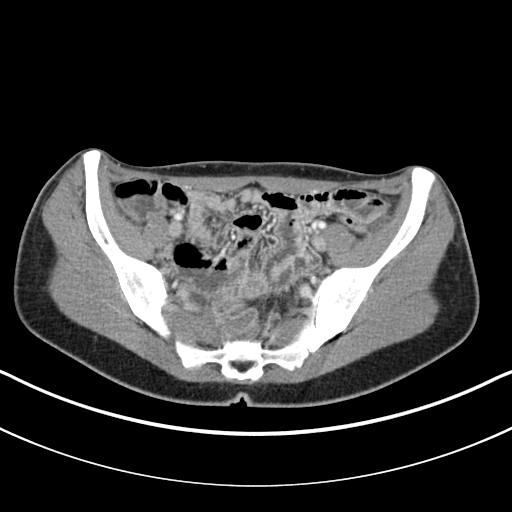
[im 37/87  soft-tissue]
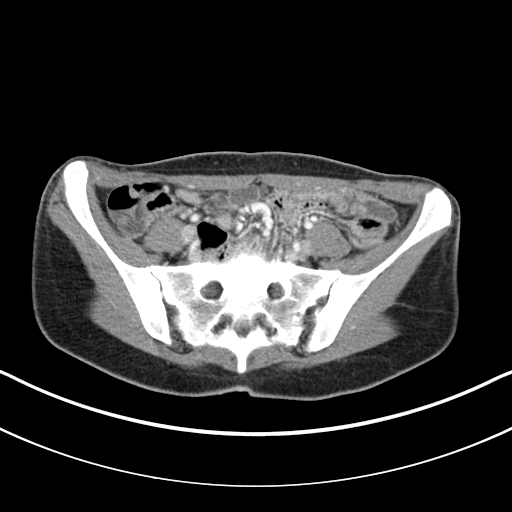
[im 46/87  soft-tissue]
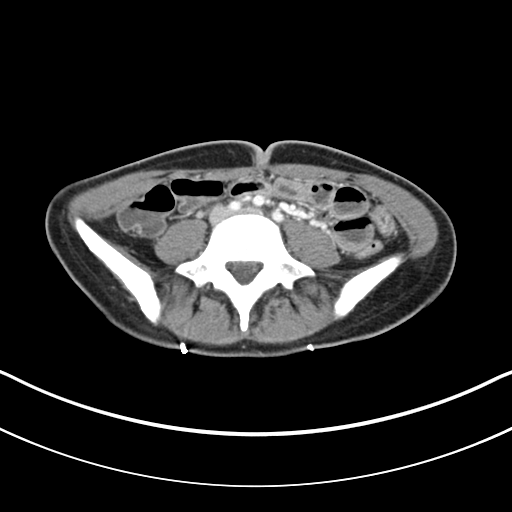
[im 50/87  soft-tissue]
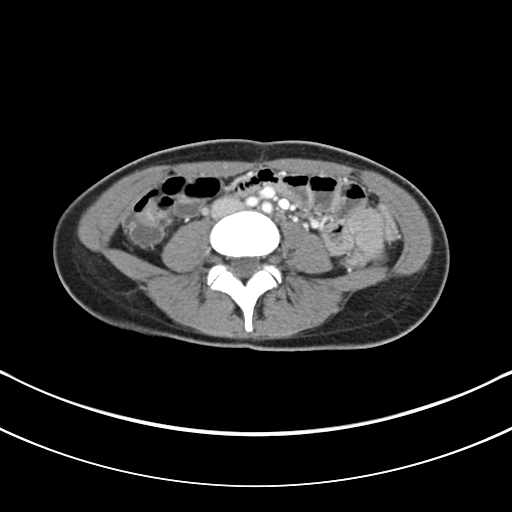
[im 55/87  soft-tissue]
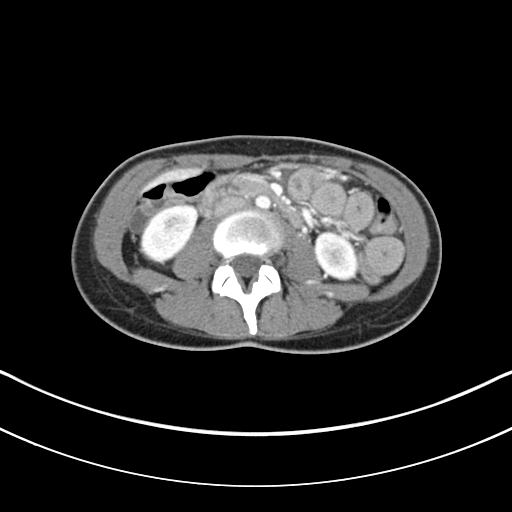
[im 55/87  bone]
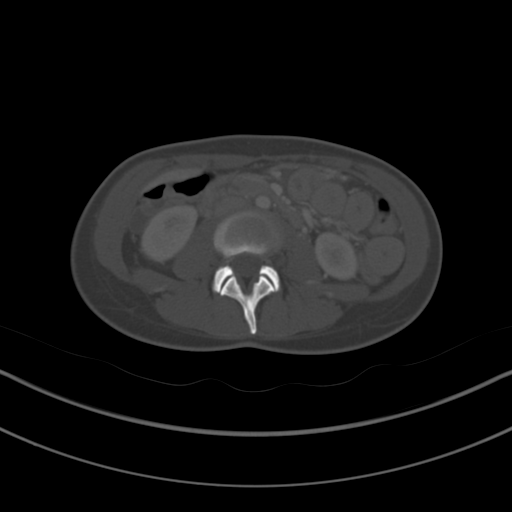
[im 64/87  soft-tissue]
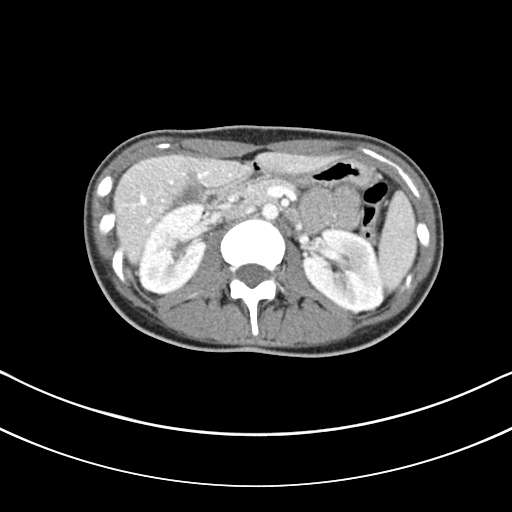
[im 68/87  soft-tissue]
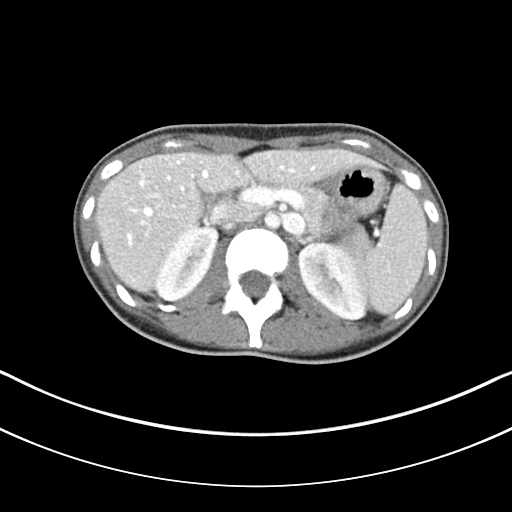
[im 73/87  soft-tissue]
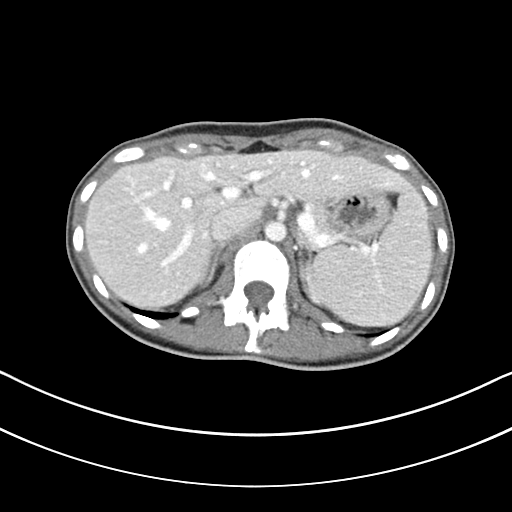
[im 82/87  soft-tissue]
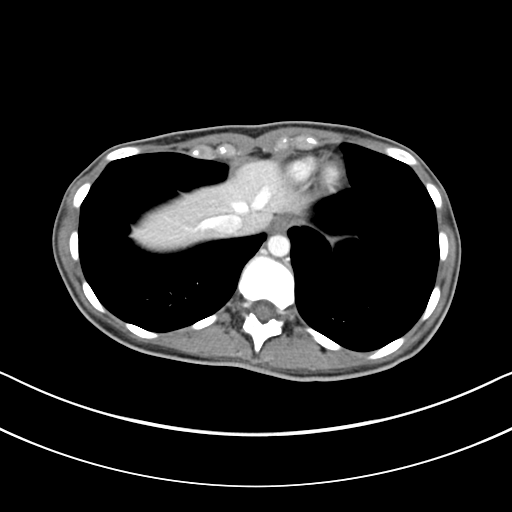

[Series 5: coronal st · coronal · 0.67mm/px · 3 of 108 slices shown]
[im 36/108  soft-tissue]
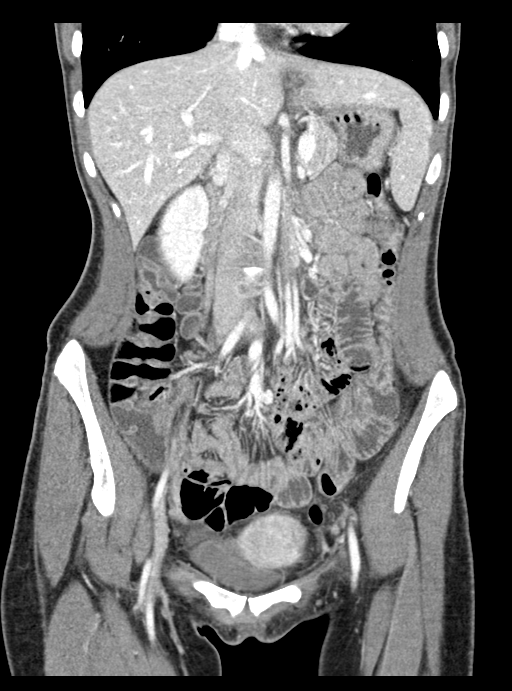
[im 48/108  soft-tissue]
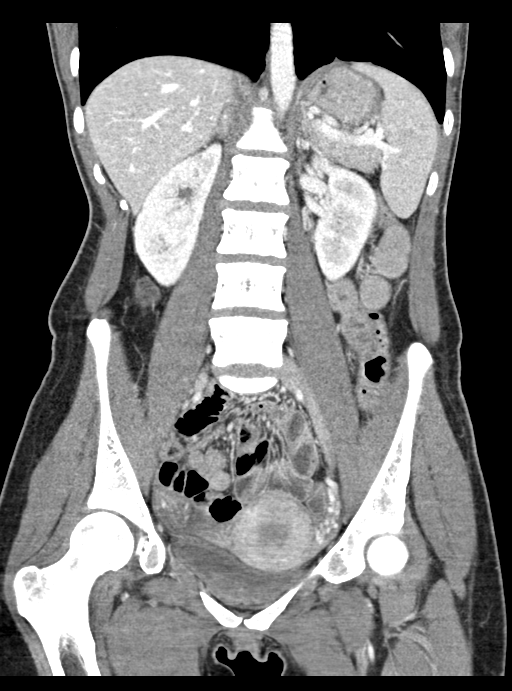
[im 60/108  soft-tissue]
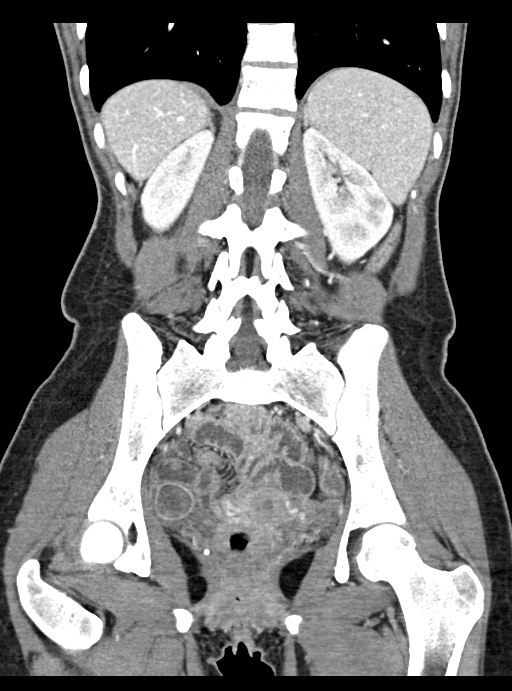

[16 of 46 positions shown; findings below may reference images not displayed]

FINDINGS: Lower chest: No acute abnormality.

Hepatobiliary: Insert CT livers

Pancreas: No focal lesion. Normal pancreatic contour. No surrounding
inflammatory changes. No main pancreatic ductal dilatation.

Spleen: Normal in size without focal abnormality.

Adrenals/Urinary Tract:

No adrenal nodule bilaterally.

Bilateral kidneys enhance symmetrically. No hydronephrosis. No
hydroureter.

The urinary bladder is unremarkable.

Stomach/Bowel: Stomach is within normal limits. No evidence of bowel
wall thickening or dilatation. Fluid density within the large bowel
is chest the fast transition state. Appendix appears normal.

Vascular/Lymphatic: No significant vascular findings are present. No
enlarged abdominal or pelvic lymph nodes.

Reproductive: 3.1 cm simple cystic lesion within the right adnexa.
No follow-up imaging recommended. Note: This recommendation does not
apply to premenarchal patients and to those with increased risk
(genetic, family history, elevated tumor markers or other high-risk
factors) of ovarian cancer. Reference: JACR [DATE]):[id number].
Otherwise the uterus and bilateral adnexa are unremarkable.

Other: Trace simple free fluid within the pelvis which can be
physiologic in etiology. No intraperitoneal free gas. No organized
fluid collection.

Musculoskeletal: No acute or significant osseous findings.
IMPRESSION: 1. Fast transition state. Otherwise no acute intra-abdominal or
intrapelvic abnormality.
2. Normal appendix.

## 2022-06-23 NOTE — Progress Notes (Deleted)
   Acute Office Visit  Subjective:     Patient ID: Nicole Huff, female    DOB: 07-09-1991, 31 y.o.   MRN: [id number]  No chief complaint on file.   HPI Patient is in today for ear pain.   EAR PAIN  Duration: {Blank single:19197::"days","weeks","months"} Involved ear(s): {Blank single:19197::"left","right","bilateral"} Severity:  {Blank single:19197::"mild","moderate","severe","1/10","2/10","3/10","4/10","5/10","6/10","7/10","8/10","9/10","10/10"}  Quality:  {Blank multiple:19196::"sharp","dull","aching","burning","cramping","ill-defined","itchy","pressure-like","pulling","shooting","sore","stabbing","tender","tearing","throbbing"} Fever: {Blank single:19197::"yes","no"} Otorrhea: {Blank single:19197::"yes","no"} Upper respiratory infection symptoms: {Blank single:19197::"yes","no"} Pruritus: {Blank single:19197::"yes","no"} Hearing loss: {Blank single:19197::"yes","no"} Water immersion {Blank single:19197::"yes","no"} Using Q-tips: {Blank single:19197::"yes","no"} Recurrent otitis media: {Blank single:19197::"yes","no"} Status: {Blank multiple:19196::"better","worse","stable","fluctuating"} Treatments attempted: {Blank single:19197::"none","pseudoephedrine"}  ROS      Objective:    There were no vitals taken for this visit. {Vitals History (Optional):23777}  Physical Exam  No results found for any visits on 06/24/22.      Assessment & Plan:   Problem List Items Addressed This Visit   None   No orders of the defined types were placed in this encounter.   No follow-ups on file.  Gerre Scull, NP

## 2022-06-24 ENCOUNTER — Ambulatory Visit: Payer: Self-pay | Admitting: Nurse Practitioner

## 2022-07-10 ENCOUNTER — Other Ambulatory Visit: Payer: Self-pay

## 2022-07-10 ENCOUNTER — Encounter (HOSPITAL_BASED_OUTPATIENT_CLINIC_OR_DEPARTMENT_OTHER): Payer: Self-pay

## 2022-07-10 ENCOUNTER — Emergency Department (HOSPITAL_BASED_OUTPATIENT_CLINIC_OR_DEPARTMENT_OTHER)
Admission: EM | Admit: 2022-07-10 | Discharge: 2022-07-11 | Disposition: A | Discharge status: Home / Self Care | Payer: Self-pay | Attending: Emergency Medicine | Admitting: Emergency Medicine

## 2022-07-10 DIAGNOSIS — R112 Nausea with vomiting, unspecified: Secondary | ICD-10-CM | POA: Insufficient documentation

## 2022-07-10 DIAGNOSIS — R109 Unspecified abdominal pain: Secondary | ICD-10-CM

## 2022-07-10 DIAGNOSIS — R1013 Epigastric pain: Secondary | ICD-10-CM | POA: Insufficient documentation

## 2022-07-10 DIAGNOSIS — R1033 Periumbilical pain: Secondary | ICD-10-CM | POA: Insufficient documentation

## 2022-07-10 LAB — COMPREHENSIVE METABOLIC PANEL
ALT: 14 U/L (ref 0–44)
AST: 17 U/L (ref 15–41)
Albumin: 5.2 g/dL — ABNORMAL HIGH (ref 3.5–5.0)
Alkaline Phosphatase: 51 U/L (ref 38–126)
Anion gap: 13 (ref 5–15)
BUN: 10 mg/dL (ref 6–20)
CO2: 27 mmol/L (ref 22–32)
Calcium: 9.9 mg/dL (ref 8.9–10.3)
Chloride: 100 mmol/L (ref 98–111)
Creatinine, Ser: 0.71 mg/dL (ref 0.44–1.00)
GFR, Estimated: >60 mL/min (ref >60)
Glucose, Bld: 96 mg/dL (ref 70–99)
Potassium: 3.6 mmol/L (ref 3.5–5.1)
Sodium: 140 mmol/L (ref 135–145)
Total Bilirubin: 0.9 mg/dL (ref 0.3–1.2)
Total Protein: 7.9 g/dL (ref 6.5–8.1)

## 2022-07-10 LAB — CBC
HCT: 40.8 % (ref 36.0–46.0)
Hemoglobin: 13.9 g/dL (ref 12.0–15.0)
MCH: 28.9 pg (ref 26.0–34.0)
MCHC: 34.1 g/dL (ref 30.0–36.0)
MCV: 84.8 fL (ref 80.0–100.0)
Platelets: 258 10*3/uL (ref 150–400)
RBC: 4.81 MIL/uL (ref 3.87–5.11)
RDW: 12.9 % (ref 11.5–15.5)
WBC: 10.2 10*3/uL (ref 4.0–10.5)
nRBC: 0 % (ref 0.0–0.2)

## 2022-07-10 LAB — URINALYSIS, ROUTINE W REFLEX MICROSCOPIC
Bilirubin Urine: NEGATIVE
Glucose, UA: NEGATIVE mg/dL
Hgb urine dipstick: NEGATIVE
Ketones, ur: 15 mg/dL — AB
Leukocytes,Ua: NEGATIVE
Nitrite: NEGATIVE
Specific Gravity, Urine: 1.018 (ref 1.005–1.030)
pH: 7 (ref 5.0–8.0)

## 2022-07-10 LAB — PREGNANCY, URINE: Preg Test, Ur: NEGATIVE

## 2022-07-10 LAB — LIPASE, BLOOD: Lipase: <10 U/L — ABNORMAL LOW (ref 11–51)

## 2022-07-10 MED ORDER — ONDANSETRON HCL 4 MG/2ML IJ SOLN
4.0000 mg | Freq: Once | INTRAMUSCULAR | Status: DC
  Filled 2022-07-10: qty 2

## 2022-07-10 MED ORDER — ALUM & MAG HYDROXIDE-SIMETH 200-200-20 MG/5ML PO SUSP
30.0000 mL | Freq: Once | ORAL | Status: AC
  Administered 2022-07-11: 30 mL via ORAL
  Filled 2022-07-10: qty 30

## 2022-07-10 MED ORDER — LIDOCAINE VISCOUS HCL 2 % MT SOLN
15.0000 mL | Freq: Once | OROMUCOSAL | Status: AC
  Administered 2022-07-11: 15 mL via ORAL
  Filled 2022-07-10: qty 15

## 2022-07-10 NOTE — ED Provider Notes (Signed)
[facility] EMERGENCY DEPT Provider Note  CSN: [id number] Arrival date & time: 07/10/22 1926  Chief Complaint(s) Abdominal Pain  HPI Nicole Huff is a 31 y.o. female {Add pertinent medical, surgical, social history, OB history to HPI:1}   The history is provided by the patient.  Abdominal Pain Pain location:  Epigastric and periumbilical Pain quality: aching and cramping   Pain severity:  Moderate Onset quality:  Gradual Duration:  1 day Timing:  Constant Progression:  Waxing and waning Chronicity:  Recurrent Relieved by:  None tried Associated symptoms: no constipation, no diarrhea, no nausea and no vomiting     Past Medical History Past Medical History:  Diagnosis Date   Medical history non-contributory    Patient Active Problem List   Diagnosis Date Noted   Epigastric pain 11/12/2021   Adjustment reaction with anxiety and depression 02/20/2021   Nausea vomiting and diarrhea 02/13/2021   Home Medication(s) Prior to Admission medications   Medication Sig Start Date End Date Taking? Authorizing Provider  escitalopram (LEXAPRO) 10 MG tablet Take 1 tablet (10 mg total) by mouth daily. for anxiety and depression. 04/13/22   Pleas Koch, NP  omeprazole (PRILOSEC) 20 MG capsule Take 1 capsule (20 mg total) by mouth daily. 11/12/21   Michela Pitcher, NP                                                                                                                                    Allergies Patient has no known allergies.  Review of Systems Review of Systems  Gastrointestinal:  Positive for abdominal pain. Negative for constipation, diarrhea, nausea and vomiting.   As noted in HPI  Physical Exam Vital Signs  I have reviewed the triage vital signs BP 93/62   Pulse 71   Temp 98.1 F (36.7 C)   Resp 18   Ht 5\' 7"  (1.702 m)   Wt 52.2 kg   LMP 07/05/2022   SpO2 100%   BMI 18.01 kg/m  *** Physical Exam  ED Results and  Treatments Labs (all labs ordered are listed, but only abnormal results are displayed) Labs Reviewed  LIPASE, BLOOD - Abnormal; Notable for the following components:      Result Value   Lipase <10 (*)    All other components within normal limits  COMPREHENSIVE METABOLIC PANEL - Abnormal; Notable for the following components:   Albumin 5.2 (*)    All other components within normal limits  URINALYSIS, ROUTINE W REFLEX MICROSCOPIC - Abnormal; Notable for the following components:   Ketones, ur 15 (*)    Protein, ur TRACE (*)    All other components within normal limits  CBC  PREGNANCY, URINE  EKG  EKG Interpretation  Date/Time:    Ventricular Rate:    PR Interval:    QRS Duration:   QT Interval:    QTC Calculation:   R Axis:     Text Interpretation:         Radiology No results found.  Medications Ordered in ED Medications  alum & mag hydroxide-simeth (MAALOX/MYLANTA) 200-200-20 MG/5ML suspension 30 mL (has no administration in time range)    And  lidocaine (XYLOCAINE) 2 % viscous mouth solution 15 mL (has no administration in time range)                                                                                                                                     Procedures Procedures  (including critical care time)  Medical Decision Making / ED Course   Medical Decision Making Amount and/or Complexity of Data Reviewed Labs: ordered.          Final Clinical Impression(s) / ED Diagnoses Final diagnoses:  None    {Document critical care time when appropriate:1}  {Document review of labs and clinical decision tools ie heart score, Chads2Vasc2 etc:1}  {Document your independent review of radiology images, and any outside records:1} {Document your discussion with family members, caretakers, and with consultants:1} {Document social  determinants of health affecting pt's care:1} {Document your decision making why or why not admission, treatments were needed:1} This chart was dictated using voice recognition software.  Despite best efforts to proofread,  errors can occur which can change the documentation meaning.

## 2022-07-10 NOTE — ED Triage Notes (Signed)
POV, this morning pt began having lower abd pain and vomiting, emesis x 4, denies diarrhea and sts normal BM today.  Amb, A&O x4.

## 2022-07-11 MED ORDER — LIDOCAINE VISCOUS HCL 2 % MT SOLN: 10.0000 mL | Freq: Four times a day (QID) | OROMUCOSAL | 0 refills | Status: DC | PRN

## 2022-07-11 MED ORDER — FAMOTIDINE 20 MG PO TABS: 20.0000 mg | ORAL_TABLET | Freq: Every day | ORAL | 0 refills | Status: DC

## 2022-07-11 MED ORDER — ONDANSETRON 4 MG PO TBDP: 4.0000 mg | ORAL_TABLET | Freq: Three times a day (TID) | ORAL | 0 refills | Status: AC | PRN

## 2022-07-11 MED ORDER — ONDANSETRON 4 MG PO TBDP
4.0000 mg | ORAL_TABLET | Freq: Once | ORAL | Status: AC
  Administered 2022-07-11: 4 mg via ORAL
  Filled 2022-07-11: qty 1

## 2022-07-11 MED ORDER — MAALOX MAX 400-400-40 MG/5ML PO SUSP: 10.0000 mL | Freq: Four times a day (QID) | ORAL | 0 refills | Status: DC | PRN

## 2022-10-06 ENCOUNTER — Other Ambulatory Visit: Payer: Self-pay | Admitting: Primary Care

## 2022-10-06 DIAGNOSIS — F4323 Adjustment disorder with mixed anxiety and depressed mood: Secondary | ICD-10-CM

## 2022-10-06 MED ORDER — ESCITALOPRAM OXALATE 10 MG PO TABS
10.0000 mg | ORAL_TABLET | Freq: Every day | ORAL | 0 refills | Status: DC
  Filled 2022-10-06: qty 30, 30d supply, fill #0

## 2022-10-06 NOTE — Telephone Encounter (Signed)
From: [name] To: Office of Doreene Nest, NP Sent: 10/05/2022 10:38 PM EST Subject: Medication Renewal Request  Refills have been requested for the following medications:   escitalopram (LEXAPRO) 10 MG tablet [Wilene Pharo K Katina Remick]  Preferred pharmacy: [facility] Delivery method: Mail

## 2022-10-07 ENCOUNTER — Other Ambulatory Visit (HOSPITAL_COMMUNITY): Payer: Self-pay

## 2022-10-07 ENCOUNTER — Other Ambulatory Visit: Payer: Self-pay

## 2022-10-14 ENCOUNTER — Ambulatory Visit: Payer: Self-pay | Admitting: Primary Care

## 2022-10-22 ENCOUNTER — Ambulatory Visit (INDEPENDENT_AMBULATORY_CARE_PROVIDER_SITE_OTHER): Payer: BC Managed Care – PPO | Admitting: Primary Care

## 2022-10-22 ENCOUNTER — Encounter: Payer: Self-pay | Admitting: Primary Care

## 2022-10-22 VITALS — BP 90/64 | HR 66 | Temp 97.0°F | Ht 67.0 in | Wt 124.0 lb

## 2022-10-22 DIAGNOSIS — F4323 Adjustment disorder with mixed anxiety and depressed mood: Secondary | ICD-10-CM | POA: Diagnosis not present

## 2022-10-22 DIAGNOSIS — R1013 Epigastric pain: Secondary | ICD-10-CM | POA: Diagnosis not present

## 2022-10-22 MED ORDER — ESCITALOPRAM OXALATE 5 MG PO TABS: 5.0000 mg | ORAL_TABLET | Freq: Every day | ORAL | 0 refills | Status: DC

## 2022-10-22 NOTE — Assessment & Plan Note (Addendum)
Controlled.  Agree to try weaning off Lexapro.  Start by reducing Lexapro 5 mg daily for several weeks, then stop. She will update via MyChart.

## 2022-10-22 NOTE — Progress Notes (Signed)
Subjective:    Patient ID: Nicole Huff, female    DOB: [birth year], 31 y.o.   MRN: [id number]  HPI  Nicole Huff is a very pleasant 31 y.o. female with a history of anxiety and depression who presents today for follow up of anxiety and depression.  Currently managed on Lexapro 10 mg daily. Historically has done well on this regimen. She missed about 10 days of Lexapro in early September 2023 and developed nausea and head pressure. She resumed her Lexapro in late September 2023 and those symptoms abated.   She feels like she is in a good place with her anxiety. She is in a new relationship which has been healthy for her. 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   She questions if she should try coming off Lexapro as her boyfriend has noticed her being "robotic" with her emotions and mood. She doesn't want to experience the side effects she had before.    Review of Systems  Respiratory:  Negative for shortness of breath.   Cardiovascular:  Negative for chest pain.  Psychiatric/Behavioral:  Negative for sleep disturbance and suicidal ideas. The patient is not nervous/anxious.          Past Medical History:  Diagnosis Date   Medical history non-contributory    Nausea vomiting and diarrhea 02/13/2021    Social History   Socioeconomic History   Marital status: Single    Spouse name: Not on file   Number of children: Not on file   Years of education: Not on file   Highest education level: Not on file  Occupational History   Not on file  Tobacco Use   Smoking status: Never   Smokeless tobacco: Never  Substance and Sexual Activity   Alcohol use: Yes    Comment: rarely   Drug use: No   Sexual activity: Yes    Birth control/protection: Implant, Pill  Other Topics Concern   Not on file  Social History Narrative   Not on file   Social Determinants of Health   Financial Resource Strain: Not on  file  Food Insecurity: Not on file  Transportation Needs: Not on file  Physical Activity: Not on file  Stress: Not on file  Social Connections: Not on file  Intimate Partner Violence: Not on file    Past Surgical History:  Procedure Laterality Date   NO PAST SURGERIES      Family History  Problem Relation Age of Onset   Thyroid disease Mother    Thyroid disease Maternal Grandmother    Diabetes Maternal Grandfather     No Known Allergies  Current Outpatient Medications on File Prior to Visit  Medication Sig Dispense Refill   famotidine (PEPCID) 20 MG tablet Take 1 tablet (20 mg total) by mouth daily. 30 tablet 0   lidocaine (XYLOCAINE) 2 % solution Use as directed 10 mLs in the mouth or throat every 6 (six) hours as needed (stomach pain). 100 mL 0   omeprazole (PRILOSEC) 20 MG capsule Take 1 capsule (20 mg total) by mouth daily. 30 capsule 0   alum & mag hydroxide-simeth (MAALOX MAX) 400-400-40 MG/5ML suspension Take 10 mLs by mouth every 6 (six) hours as needed for indigestion. (Patient not taking: Reported on 10/22/2022) 355 mL 0   No current facility-administered medications on file prior to visit.    BP 90/64   Pulse 66   Temp Marland Kitchen)  97 F (36.1 C) (Temporal)   Ht 5\' 7"  (1.702 m)   Wt 124 lb (56.2 kg)   SpO2 100%   BMI 19.42 kg/m  Objective:   Physical Exam Cardiovascular:     Rate and Rhythm: Normal rate and regular rhythm.  Pulmonary:     Effort: Pulmonary effort is normal.     Breath sounds: Normal breath sounds.  Musculoskeletal:     Cervical back: Neck supple.  Skin:    General: Skin is warm and dry.           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  Agree to try weaning off Lexapro.  Start by reducing Lexapro 5 mg daily for several weeks, then stop. She will update via MyChart.      Relevant Medications   escitalopram (LEXAPRO) 5  MG tablet   Epigastric pain    Intermittent, improved with famotidine and Maalox.  Continue to monitor.          , NP

## 2022-10-22 NOTE — Patient Instructions (Signed)
Reduce your Lexapro to 5 mg daily. I sent a new prescription to your pharmacy.  Please update me via MyChart in a few weeks.  It was a pleasure to see you today!

## 2022-10-22 NOTE — Assessment & Plan Note (Signed)
Intermittent, improved with famotidine and Maalox.  Continue to monitor.

## 2023-01-19 DIAGNOSIS — Z01419 Encounter for gynecological examination (general) (routine) without abnormal findings: Secondary | ICD-10-CM | POA: Diagnosis not present

## 2023-01-19 DIAGNOSIS — Z31430 Encounter of female for testing for genetic disease carrier status for procreative management: Secondary | ICD-10-CM | POA: Diagnosis not present

## 2023-01-19 DIAGNOSIS — Z139 Encounter for screening, unspecified: Secondary | ICD-10-CM | POA: Diagnosis not present

## 2023-01-19 DIAGNOSIS — Z1371 Encounter for nonprocreative screening for genetic disease carrier status: Secondary | ICD-10-CM | POA: Diagnosis not present

## 2023-01-19 DIAGNOSIS — Z681 Body mass index (BMI) 19 or less, adult: Secondary | ICD-10-CM | POA: Diagnosis not present

## 2023-01-20 ENCOUNTER — Other Ambulatory Visit: Payer: Self-pay | Admitting: Obstetrics and Gynecology

## 2023-01-20 DIAGNOSIS — N979 Female infertility, unspecified: Secondary | ICD-10-CM

## 2023-03-29 DIAGNOSIS — L239 Allergic contact dermatitis, unspecified cause: Secondary | ICD-10-CM | POA: Diagnosis not present

## 2023-03-29 DIAGNOSIS — D225 Melanocytic nevi of trunk: Secondary | ICD-10-CM | POA: Diagnosis not present

## 2023-03-29 DIAGNOSIS — L700 Acne vulgaris: Secondary | ICD-10-CM | POA: Diagnosis not present

## 2023-05-27 ENCOUNTER — Emergency Department (HOSPITAL_COMMUNITY): Payer: BC Managed Care – PPO

## 2023-05-27 ENCOUNTER — Encounter (HOSPITAL_COMMUNITY): Payer: Self-pay

## 2023-05-27 ENCOUNTER — Other Ambulatory Visit: Payer: Self-pay

## 2023-05-27 ENCOUNTER — Emergency Department (HOSPITAL_COMMUNITY)
Admission: EM | Admit: 2023-05-27 | Discharge: 2023-05-27 | Disposition: A | Discharge status: Home / Self Care | Payer: BC Managed Care – PPO | Source: Home / Self Care | Attending: Emergency Medicine | Admitting: Emergency Medicine

## 2023-05-27 DIAGNOSIS — K279 Peptic ulcer, site unspecified, unspecified as acute or chronic, without hemorrhage or perforation: Secondary | ICD-10-CM | POA: Insufficient documentation

## 2023-05-27 DIAGNOSIS — R1013 Epigastric pain: Secondary | ICD-10-CM | POA: Diagnosis not present

## 2023-05-27 DIAGNOSIS — K760 Fatty (change of) liver, not elsewhere classified: Secondary | ICD-10-CM | POA: Diagnosis not present

## 2023-05-27 DIAGNOSIS — R109 Unspecified abdominal pain: Secondary | ICD-10-CM | POA: Diagnosis not present

## 2023-05-27 DIAGNOSIS — R1010 Upper abdominal pain, unspecified: Secondary | ICD-10-CM | POA: Diagnosis not present

## 2023-05-27 LAB — COMPREHENSIVE METABOLIC PANEL
ALT: 13 U/L (ref 0–44)
AST: 14 U/L — ABNORMAL LOW (ref 15–41)
Albumin: 4.1 g/dL (ref 3.5–5.0)
Alkaline Phosphatase: 44 U/L (ref 38–126)
Anion gap: 7 (ref 5–15)
BUN: 13 mg/dL (ref 6–20)
CO2: 26 mmol/L (ref 22–32)
Calcium: 9 mg/dL (ref 8.9–10.3)
Chloride: 103 mmol/L (ref 98–111)
Creatinine, Ser: 0.73 mg/dL (ref 0.44–1.00)
GFR, Estimated: >60 mL/min (ref >60)
Glucose, Bld: 96 mg/dL (ref 70–99)
Potassium: 3.6 mmol/L (ref 3.5–5.1)
Sodium: 136 mmol/L (ref 135–145)
Total Bilirubin: 0.7 mg/dL (ref 0.3–1.2)
Total Protein: 7.3 g/dL (ref 6.5–8.1)

## 2023-05-27 LAB — CBC
HCT: 39.1 % (ref 36.0–46.0)
Hemoglobin: 12.6 g/dL (ref 12.0–15.0)
MCH: 27.9 pg (ref 26.0–34.0)
MCHC: 32.2 g/dL (ref 30.0–36.0)
MCV: 86.7 fL (ref 80.0–100.0)
Platelets: 180 10*3/uL (ref 150–400)
RBC: 4.51 MIL/uL (ref 3.87–5.11)
RDW: 13.1 % (ref 11.5–15.5)
WBC: 6.8 10*3/uL (ref 4.0–10.5)
nRBC: 0 % (ref 0.0–0.2)

## 2023-05-27 LAB — LIPASE, BLOOD: Lipase: 33 U/L (ref 11–51)

## 2023-05-27 LAB — URINALYSIS, ROUTINE W REFLEX MICROSCOPIC
Bilirubin Urine: NEGATIVE
Glucose, UA: NEGATIVE mg/dL
Hgb urine dipstick: NEGATIVE
Ketones, ur: NEGATIVE mg/dL
Leukocytes,Ua: NEGATIVE
Nitrite: NEGATIVE
Protein, ur: NEGATIVE mg/dL
Specific Gravity, Urine: 1.019 (ref 1.005–1.030)
pH: 5 (ref 5.0–8.0)

## 2023-05-27 LAB — PREGNANCY, URINE: Preg Test, Ur: NEGATIVE

## 2023-05-27 MED ORDER — SUCRALFATE 1 G PO TABS: 1.0000 g | ORAL_TABLET | Freq: Three times a day (TID) | ORAL | 0 refills | Status: DC

## 2023-05-27 MED ORDER — MORPHINE SULFATE (PF) 4 MG/ML IV SOLN
4.0000 mg | Freq: Once | INTRAVENOUS | Status: AC
  Administered 2023-05-27: 4 mg via INTRAVENOUS
  Filled 2023-05-27: qty 1

## 2023-05-27 MED ORDER — ONDANSETRON HCL 4 MG/2ML IJ SOLN
4.0000 mg | Freq: Once | INTRAMUSCULAR | Status: AC
  Administered 2023-05-27: 4 mg via INTRAVENOUS
  Filled 2023-05-27: qty 2

## 2023-05-27 MED ORDER — HYDROCODONE-ACETAMINOPHEN 5-325 MG PO TABS: 1.0000 | ORAL_TABLET | Freq: Four times a day (QID) | ORAL | 0 refills | Status: DC | PRN

## 2023-05-27 MED ORDER — ONDANSETRON 8 MG PO TBDP: 8.0000 mg | ORAL_TABLET | Freq: Three times a day (TID) | ORAL | 0 refills | Status: DC | PRN

## 2023-05-27 MED ORDER — PROCHLORPERAZINE EDISYLATE 10 MG/2ML IJ SOLN
10.0000 mg | Freq: Once | INTRAMUSCULAR | Status: AC
  Administered 2023-05-27: 10 mg via INTRAVENOUS
  Filled 2023-05-27: qty 2

## 2023-05-27 MED ORDER — SODIUM CHLORIDE (PF) 0.9 % IJ SOLN
INTRAMUSCULAR | Status: AC
  Filled 2023-05-27: qty 50

## 2023-05-27 MED ORDER — PANTOPRAZOLE SODIUM 40 MG IV SOLR
40.0000 mg | Freq: Once | INTRAVENOUS | Status: AC
  Administered 2023-05-27: 40 mg via INTRAVENOUS
  Filled 2023-05-27: qty 10

## 2023-05-27 MED ORDER — PANTOPRAZOLE SODIUM 40 MG PO TBEC: 40.0000 mg | DELAYED_RELEASE_TABLET | Freq: Every day | ORAL | 0 refills | Status: DC

## 2023-05-27 MED ORDER — IOHEXOL 300 MG/ML  SOLN
100.0000 mL | Freq: Once | INTRAMUSCULAR | Status: AC | PRN
  Administered 2023-05-27: 100 mL via INTRAVENOUS

## 2023-05-27 NOTE — Discharge Instructions (Addendum)
The pain medications as needed.  Make sure to take the antacid medications as prescribed.  Call the gastroenterologist office to schedule follow-up appointment for further evaluation and treatment of your abdominal pain and CT scan findings suggestive of possible peptic ulcer disease

## 2023-05-27 NOTE — ED Provider Notes (Signed)
[place] [facility] AT [facility] Provider Note   CSN: [id number] Arrival date & time: 05/27/23  1478     History  Chief Complaint  Patient presents with   Abdominal Pain    Nicole Huff is a 32 y.o. female.   Abdominal Pain    Patient has a history of gastritis who presents to the ED for abdominal pain.  Patient states she started having symptoms this morning at about 4 AM.  It is a dull aching and sharp pain that comes and goes.  At times it is severe.  She has had nausea with it but no vomiting.  She denies any dysuria.  No diarrhea.  The pain is in her epigastric region.  Patient does have history of gastritis usually triggered by foods.  Does not recall any foods that would have triggered this at this time  Home Medications Prior to Admission medications   Medication Sig Start Date End Date Taking? Authorizing Provider  HYDROcodone-acetaminophen (NORCO/VICODIN) 5-325 MG tablet Take 1 tablet by mouth every 6 (six) hours as needed. 05/27/23  Yes Linwood Dibbles, MD  ondansetron (ZOFRAN-ODT) 8 MG disintegrating tablet Take 1 tablet (8 mg total) by mouth every 8 (eight) hours as needed for nausea or vomiting. 05/27/23  Yes Linwood Dibbles, MD  pantoprazole (PROTONIX) 40 MG tablet Take 1 tablet (40 mg total) by mouth daily. 05/27/23  Yes Linwood Dibbles, MD  sucralfate (CARAFATE) 1 g tablet Take 1 tablet (1 g total) by mouth 4 (four) times daily -  with meals and at bedtime. 05/27/23  Yes Linwood Dibbles, MD  alum & mag hydroxide-simeth (MAALOX MAX) 400-400-40 MG/5ML suspension Take 10 mLs by mouth every 6 (six) hours as needed for indigestion. Patient not taking: Reported on 10/22/2022 07/11/22   Nira Conn, MD  escitalopram (LEXAPRO) 5 MG tablet Take 1 tablet (5 mg total) by mouth daily. For anxiety. 10/22/22   Doreene Nest, NP  famotidine (PEPCID) 20 MG tablet Take 1 tablet (20 mg total) by mouth daily. 07/11/22   Cardama, Amadeo Garnet, MD  lidocaine (XYLOCAINE) 2  % solution Use as directed 10 mLs in the mouth or throat every 6 (six) hours as needed (stomach pain). 07/11/22   Nira Conn, MD  omeprazole (PRILOSEC) 20 MG capsule Take 1 capsule (20 mg total) by mouth daily. 11/12/21   Eden Emms, NP      Allergies    Patient has no known allergies.    Review of Systems   Review of Systems  Gastrointestinal:  Positive for abdominal pain.    Physical Exam Updated Vital Signs BP 101/64   Pulse (!) 52   Temp 97.8 F (36.6 C) (Oral)   Resp 18   Ht 1.702 m (5\' 7" )   Wt 54.4 kg   LMP 04/27/2023 (Approximate)   SpO2 99%   BMI 18.79 kg/m  Physical Exam Vitals and nursing note reviewed.  Constitutional:      General: She is not in acute distress.    Appearance: She is well-developed.  HENT:     Head: Normocephalic and atraumatic.     Right Ear: External ear normal.     Left Ear: External ear normal.  Eyes:     General: No scleral icterus.       Right eye: No discharge.        Left eye: No discharge.     Conjunctiva/sclera: Conjunctivae normal.  Neck:     Trachea: No  tracheal deviation.  Cardiovascular:     Rate and Rhythm: Normal rate and regular rhythm.  Pulmonary:     Effort: Pulmonary effort is normal. No respiratory distress.     Breath sounds: Normal breath sounds. No stridor. No wheezing or rales.  Abdominal:     General: Bowel sounds are normal. There is no distension.     Palpations: Abdomen is soft.     Tenderness: There is abdominal tenderness in the epigastric area. There is no guarding or rebound.  Musculoskeletal:        General: No tenderness or deformity.     Cervical back: Neck supple.  Skin:    General: Skin is warm and dry.     Findings: No rash.  Neurological:     General: No focal deficit present.     Mental Status: She is alert.     Cranial Nerves: No cranial nerve deficit, dysarthria or facial asymmetry.     Sensory: No sensory deficit.     Motor: No abnormal muscle tone or seizure activity.      Coordination: Coordination normal.  Psychiatric:        Mood and Affect: Mood normal.     ED Results / Procedures / Treatments   Labs (all labs ordered are listed, but only abnormal results are displayed) Labs Reviewed  COMPREHENSIVE METABOLIC PANEL - Abnormal; Notable for the following components:      Result Value   AST 14 (*)    All other components within normal limits  LIPASE, BLOOD  CBC  URINALYSIS, ROUTINE W REFLEX MICROSCOPIC  PREGNANCY, URINE    EKG None  Radiology CT ABDOMEN PELVIS W CONTRAST  Result Date: 05/27/2023 CLINICAL DATA:  Acute abdominal pain EXAM: CT ABDOMEN AND PELVIS WITH CONTRAST TECHNIQUE: Multidetector CT imaging of the abdomen and pelvis was performed using the standard protocol following bolus administration of intravenous contrast. RADIATION DOSE REDUCTION: This exam was performed according to the departmental dose-optimization program which includes automated exposure control, adjustment of the mA and/or kV according to patient size and/or use of iterative reconstruction technique. CONTRAST:  OMNIPAQUE IOHEXOL 300 MG/ML  SOLN COMPARISON:  Ultrasound earlier 05/27/2023.  CT 02/16/2021 FINDINGS: Lower chest: No acute abnormality. Hepatobiliary: Mild fatty liver infiltration. Gallbladder is nondilated. Patent portal vein. Pancreas: Unremarkable. No pancreatic ductal dilatation or surrounding inflammatory changes. Spleen: Normal in size without focal abnormality. Adrenals/Urinary Tract: Adrenal glands are unremarkable. Kidneys are normal, without renal calculi, focal lesion, or hydronephrosis. Bladder is unremarkable. Stomach/Bowel: There is fold thickening along the body and antrum of the stomach with the edema. Please correlate for gastritis or other process. Stomach is nondilated. Normal appendix in the right lower quadrant posterior to the cecum. The large bowel is of normal course and caliber with diffuse colonic stool. Small bowel is nondilated.  Vascular/Lymphatic: No significant vascular findings are present. No enlarged abdominal or pelvic lymph nodes. Reproductive: Uterus and bilateral adnexa are unremarkable. Other: Trace free fluid in the pelvis.  No free air. Musculoskeletal: No acute or significant osseous findings. IMPRESSION: There is wall thickening and edema along the body and antrum of the stomach. Please correlate for peptic ulcer disease or gastritis. Stomach is nondilated. Further workup when clinically appropriate No bowel obstruction, free air.  Trace free fluid in the pelvis. Fatty liver infiltration Electronically Signed   By: Karen Kays M.D.   On: 05/27/2023 11:33   US Abdomen Complete  Result Date: 05/27/2023 CLINICAL DATA:  Upper abdominal pain EXAM: ABDOMEN  ULTRASOUND COMPLETE COMPARISON:  None Available. FINDINGS: Gallbladder: No gallstones or wall thickening visualized. No sonographic Murphy sign noted by sonographer. Common bile duct: Diameter: 0.2 cm Liver: No focal lesion identified. Within normal limits in parenchymal echogenicity. Portal vein is patent on color Doppler imaging with normal direction of blood flow towards the liver. IVC: No abnormality visualized. Pancreas: Visualized portion unremarkable. Spleen: Size and appearance within normal limits. Right Kidney: Length: 7.9 cm. Echogenicity within normal limits. No mass or hydronephrosis visualized. Left Kidney: Length: 9.6 cm. Echogenicity within normal limits. No mass or hydronephrosis visualized. Abdominal aorta: No aneurysm visualized. Other findings: None. IMPRESSION: No ultrasound findings of the abdomen to explain upper abdominal pain. Electronically Signed   By: Jearld Lesch M.D.   On: 05/27/2023 08:33    Procedures Procedures    Medications Ordered in ED Medications  morphine (PF) 4 MG/ML injection 4 mg (4 mg Intravenous Given 05/27/23 0830)  ondansetron (ZOFRAN) injection 4 mg (4 mg Intravenous Given 05/27/23 0831)  pantoprazole (PROTONIX) injection  40 mg (40 mg Intravenous Given 05/27/23 0831)  morphine (PF) 4 MG/ML injection 4 mg (4 mg Intravenous Given 05/27/23 0954)  prochlorperazine (COMPAZINE) injection 10 mg (10 mg Intravenous Given 05/27/23 0954)  iohexol (OMNIPAQUE) 300 MG/ML solution 100 mL (100 mLs Intravenous Contrast Given 05/27/23 1034)    ED Course/ Medical Decision Making/ A&P Clinical Course as of 05/27/23 1236  Fri May 27, 2023  0749 CBC nl [JK]  0749 Comprehensive metabolic panel(!) nl [JK]  0749 Lipase, blood nl [JK]  0749 Pregnancy, urine nl [JK]  0841 Ultrasound without acute findings [JK]  0946 Patient still having pain appears very uncomfortable.  Will order additional medications.  Proceed with CT scan for bowel obstruction or other emergent etiologies causing her pain [JK]  1214 CT scan of the abdomen shows evidence of thickening and edema along the body and antrum of the stomach.  No evidence of bowel obstruction free air [JK]    Clinical Course User Index [JK] Linwood Dibbles, MD                                 Medical Decision Making Differential diagnosis includes but not limited to pancreatitis cholecystitis bowel obstruction pyelonephritis  Problems Addressed: PUD (peptic ulcer disease): acute illness or injury that poses a threat to life or bodily functions  Amount and/or Complexity of Data Reviewed Labs: ordered. Decision-making details documented in ED Course. Radiology: ordered and independent interpretation performed.  Risk Prescription drug management. Parenteral controlled substances.   Patient's laboratory tests are reassuring.  No evidence of pancreatitis or hepatitis.  Urinalysis not suggestive of infection or kidney stone.  Ultrasound did not show any signs of gallstones.  Patient however had persistent pain so a CT scan was performed.  No evidence of perforated viscus.  No bowel obstruction.  Findings are suggestive of possible gastritis or peptic ulcer disease.  Will start the patient on  antacids.  Recommend close outpatient follow-up with GI.  Patient may benefit from endoscopy and further evaluation.        Final Clinical Impression(s) / ED Diagnoses Final diagnoses:  PUD (peptic ulcer disease)    Rx / DC Orders ED Discharge Orders          Ordered    pantoprazole (PROTONIX) 40 MG tablet  Daily        05/27/23 1233    sucralfate (CARAFATE) 1 g tablet  3 times daily with meals & bedtime        05/27/23 1233    ondansetron (ZOFRAN-ODT) 8 MG disintegrating tablet  Every 8 hours PRN        05/27/23 1233    HYDROcodone-acetaminophen (NORCO/VICODIN) 5-325 MG tablet  Every 6 hours PRN        05/27/23 1233              Linwood Dibbles, MD 05/27/23 1236

## 2023-05-27 NOTE — ED Triage Notes (Signed)
Upper abdominal pain since 4am. Says she has a hx of gastritis but it usually remains constant whereas this pain comes with 10-15 second bursts of achy pain.

## 2023-05-30 ENCOUNTER — Telehealth: Payer: Self-pay

## 2023-05-30 NOTE — Transitions of Care (Post Inpatient/ED Visit) (Signed)
Unable to reach pt by phone and left v/m requesting cb [phone].       05/30/2023  Name: Nicole Huff MRN: [id number] DOB: 04-30-1991  Today's TOC FU Call Status: Today's TOC FU Call Status:: Unsuccessful Call (1st Attempt) Unsuccessful Call (1st Attempt) Date: 05/30/23  Attempted to reach the patient regarding the most recent Inpatient/ED visit.  Follow Up Plan: Additional outreach attempts will be made to reach the patient to complete the Transitions of Care (Post Inpatient/ED visit) call.   Signature Lewanda Rife, LPN

## 2023-06-09 ENCOUNTER — Ambulatory Visit (INDEPENDENT_AMBULATORY_CARE_PROVIDER_SITE_OTHER): Payer: BC Managed Care – PPO | Admitting: Primary Care

## 2023-06-09 ENCOUNTER — Encounter: Payer: Self-pay | Admitting: Primary Care

## 2023-06-09 VITALS — BP 110/62 | HR 87 | Temp 98.6°F | Ht 67.0 in | Wt 124.0 lb

## 2023-06-09 DIAGNOSIS — F4323 Adjustment disorder with mixed anxiety and depressed mood: Secondary | ICD-10-CM

## 2023-06-09 DIAGNOSIS — R1013 Epigastric pain: Secondary | ICD-10-CM

## 2023-06-09 MED ORDER — CITALOPRAM HYDROBROMIDE 10 MG PO TABS: 10.0000 mg | ORAL_TABLET | Freq: Every day | ORAL | 0 refills | Status: DC

## 2023-06-09 NOTE — Assessment & Plan Note (Signed)
Recent ED visit. ED notes, labs, imaging reviewed.  Continue pantoprazole 40 mg daily. Follow-up with GI as recommended.

## 2023-06-09 NOTE — Progress Notes (Signed)
Subjective:    Patient ID: Nicole Huff, female    DOB: 12/19/1990, 32 y.o.   MRN: [id number]  Depression        Associated symptoms include myalgias.   Nicole Huff is a very pleasant 32 y.o. female with a history of anxiety and depression who presents today to discuss anxiety depression.  She was last evaluated in our office in December 2023 for follow-up of anxiety/depression.  During the time symptoms had improved but she was in a new relationship.  She did note that Lexapro was effective for her anxiety/depression, but it caused her to feel "robotic" with her emotions and mood.  Given the symptoms and her improved anxiety/depression she weaned off of Lexapro.  She presented to [facility] on [facility]-24 for a dull aching and sharp abdominal pain that began earlier that morning.  Labs were without pancreatitis or hepatitis.  Urinalysis was negative for renal stone, pregnancy, cystitis.  She underwent CT abdomen/pelvis which was suggestive of possible gastritis or peptic ulcer disease.  She was initiated on Zofran 8 mg as needed, pantoprazole 40 mg daily, Carafate 1 g 4 times daily.  Today she's feeling better from her abdominal symptoms. She's been taking pantoprazole 40 mg daily which has helped. She is not taking Carafate as the pills are too chalky and large. She has yet to connect with GI.  Since coming off Lexapro she has noticed increased anxiety, increased irritability, feeling "short tempered", feeling more down, chest tightness, muscle tension to her neck, throat fullness, worrying. She is ready to resume some form of treatment.   Review of Systems  Gastrointestinal:  Negative for abdominal pain, constipation, diarrhea, nausea and vomiting.  Musculoskeletal:  Positive for myalgias.  Psychiatric/Behavioral:  Positive for depression. The patient is nervous/anxious.          Past Medical History:  Diagnosis Date   Medical history non-contributory    Nausea vomiting and diarrhea  02/13/2021    Social History   Socioeconomic History   Marital status: Single    Spouse name: Not on file   Number of children: Not on file   Years of education: Not on file   Highest education level: Not on file  Occupational History   Not on file  Tobacco Use   Smoking status: Never   Smokeless tobacco: Never  Substance and Sexual Activity   Alcohol use: Yes    Comment: rarely   Drug use: No   Sexual activity: Yes    Birth control/protection: Implant, Pill  Other Topics Concern   Not on file  Social History Narrative   Not on file   Social Determinants of Health   Financial Resource Strain: Not on file  Food Insecurity: Not on file  Transportation Needs: Not on file  Physical Activity: Not on file  Stress: Not on file  Social Connections: Not on file  Intimate Partner Violence: Not on file    Past Surgical History:  Procedure Laterality Date   NO PAST SURGERIES      Family History  Problem Relation Age of Onset   Thyroid disease Mother    Thyroid disease Maternal Grandmother    Diabetes Maternal Grandfather     No Known Allergies  Current Outpatient Medications on File Prior to Visit  Medication Sig Dispense Refill   lidocaine (XYLOCAINE) 2 % solution Use as directed 10 mLs in the mouth or throat every 6 (six) hours as needed (stomach pain). 100 mL 0   ondansetron (  ZOFRAN-ODT) 8 MG disintegrating tablet Take 1 tablet (8 mg total) by mouth every 8 (eight) hours as needed for nausea or vomiting. 12 tablet 0   pantoprazole (PROTONIX) 40 MG tablet Take 1 tablet (40 mg total) by mouth daily. 30 tablet 0   tretinoin (RETIN-A) 0.05 % cream SMARTSIG:Sparingly Topical Every Night PRN     sucralfate (CARAFATE) 1 g tablet Take 1 tablet (1 g total) by mouth 4 (four) times daily -  with meals and at bedtime. (Patient not taking: Reported on 06/09/2023) 90 tablet 0   No current facility-administered medications on file prior to visit.    BP 110/62   Pulse 87   Temp  98.6 F (37 C) (Temporal)   Ht 5\' 7"  (1.702 m)   Wt 124 lb (56.2 kg)   LMP 04/27/2023 (Approximate)   SpO2 99%   BMI 19.42 kg/m  Objective:   Physical Exam Cardiovascular:     Rate and Rhythm: Normal rate and regular rhythm.  Pulmonary:     Effort: Pulmonary effort is normal.     Breath sounds: Normal breath sounds.  Abdominal:     General: Bowel sounds are normal.     Palpations: Abdomen is soft.     Tenderness: There is no abdominal tenderness.  Musculoskeletal:     Cervical back: Neck supple.  Skin:    General: Skin is warm and dry.  Psychiatric:        Mood and Affect: Mood normal.           Assessment & Plan:  Adjustment reaction with anxiety and depression Assessment & Plan: Deteriorated.  Discussed options, will resume medication treatment as she has historically done well.  Start citalopram 10 mg daily to potentially avoid the "robotic" emotions that she felt on Lexapro previously. She failed Zoloft in the past.  Prescription sent to pharmacy. She will update in 4 weeks via MyChart.  Orders: -     Citalopram Hydrobromide; Take 1 tablet (10 mg total) by mouth daily. for anxiety and depression.  Dispense: 90 tablet; Refill: 0  Epigastric pain Assessment & Plan: Recent ED visit. ED notes, labs, imaging reviewed.  Continue pantoprazole 40 mg daily. Follow-up with GI as recommended.         Doreene Nest, NP

## 2023-06-09 NOTE — Patient Instructions (Signed)
Start citalopram 10 mg once daily for anxiety and depression.  Follow-up with GI as discussed.  Please update me via MyChart in 4 weeks.  It was a pleasure to see you today!

## 2023-06-09 NOTE — Assessment & Plan Note (Signed)
Deteriorated.  Discussed options, will resume medication treatment as she has historically done well.  Start citalopram 10 mg daily to potentially avoid the "robotic" emotions that she felt on Lexapro previously. She failed Zoloft in the past.  Prescription sent to pharmacy. She will update in 4 weeks via MyChart.

## 2023-09-04 ENCOUNTER — Other Ambulatory Visit: Payer: Self-pay | Admitting: Primary Care

## 2023-09-04 DIAGNOSIS — F4323 Adjustment disorder with mixed anxiety and depressed mood: Secondary | ICD-10-CM

## 2023-09-04 NOTE — Telephone Encounter (Signed)
Please call patient:  How is she doing on the citalopram medication for anxiety/depression? We started this in August.   Does she want refills?

## 2023-09-05 MED ORDER — ESCITALOPRAM OXALATE 10 MG PO TABS: 10.0000 mg | ORAL_TABLET | Freq: Every day | ORAL | 2 refills | Status: DC

## 2023-09-05 NOTE — Telephone Encounter (Signed)
Unable to reach patient. Left voicemail to return call to our office.   

## 2023-09-05 NOTE — Telephone Encounter (Signed)
See MyChart message for response

## 2023-09-15 ENCOUNTER — Ambulatory Visit: Payer: BC Managed Care – PPO | Admitting: Primary Care

## 2023-09-16 ENCOUNTER — Encounter: Payer: Self-pay | Admitting: Primary Care

## 2023-11-02 ENCOUNTER — Ambulatory Visit
Admission: EM | Admit: 2023-11-02 | Discharge: 2023-11-02 | Disposition: A | Discharge status: Home / Self Care | Payer: BC Managed Care – PPO | Attending: Emergency Medicine | Admitting: Emergency Medicine

## 2023-11-02 DIAGNOSIS — J069 Acute upper respiratory infection, unspecified: Secondary | ICD-10-CM | POA: Diagnosis not present

## 2023-11-02 LAB — POCT RAPID STREP A (OFFICE): Rapid Strep A Screen: NEGATIVE

## 2023-11-02 MED ORDER — LIDOCAINE VISCOUS HCL 2 % MT SOLN: 15.0000 mL | OROMUCOSAL | 0 refills | Status: DC | PRN

## 2023-11-02 MED ORDER — AZITHROMYCIN 250 MG PO TABS: 250.0000 mg | ORAL_TABLET | Freq: Every day | ORAL | 0 refills | Status: DC

## 2023-11-02 NOTE — ED Triage Notes (Signed)
 Sore throat x 1 week, Pt states it got worse yesterday and now having pain up to left ear.

## 2023-11-02 NOTE — Discharge Instructions (Addendum)
 symptoms present for 7 days and have begun to worsen therefore begin azithromycin  to provide coverage for bacteria, take as directed  Strep test negative You may gargle and spit lidocaine  solution as needed for temporary relief of your throat pain    You can take Tylenol  and/or Ibuprofen  as needed for fever reduction and pain relief.   For cough: honey 1/2 to 1 teaspoon (you can dilute the honey in water or another fluid).  You can also use guaifenesin and dextromethorphan for cough. You can use a humidifier for chest congestion and cough.  If you don't have a humidifier, you can sit in the bathroom with the hot shower running.      For sore throat: try warm salt water gargles, cepacol lozenges, throat spray, warm tea or water with lemon/honey, popsicles or ice, or OTC cold relief medicine for throat discomfort.   For congestion: take a daily anti-histamine like Zyrtec, Claritin, and a oral decongestant, such as pseudoephedrine.  You can also use Flonase 1-2 sprays in each nostril daily.   It is important to stay hydrated: drink plenty of fluids (water, gatorade/powerade/pedialyte, juices, or teas) to keep your throat moisturized and help further relieve irritation/discomfort.

## 2023-11-03 NOTE — ED Provider Notes (Signed)
 Nicole Huff    CSN: [id number] Arrival date & time: 11/02/23  1949      History   Chief Complaint Chief Complaint  Patient presents with   Sore Throat    HPI Nicole Huff is a 33 y.o. female.   Patient presents for evaluation of a sore throat present for 7 days.  Symptoms worsening 1 day ago, has begun to experience worsening pain to the throat, left-sided ear pain.  She had congestion and cough described as mild.  Denies fever.  Tolerating food and liquids.  Possible sick contacts.  Has not attempted treatment.  Past Medical History:  Diagnosis Date   Medical history non-contributory    Nausea vomiting and diarrhea 02/13/2021    Patient Active Problem List   Diagnosis Date Noted   Epigastric pain 11/12/2021   Adjustment reaction with anxiety and depression 02/20/2021    Past Surgical History:  Procedure Laterality Date   NO PAST SURGERIES      OB History     Gravida  0   Para  0   Term  0   Preterm  0   AB  0   Living  0      SAB  0   IAB  0   Ectopic  0   Multiple  0   Live Births               Home Medications    Prior to Admission medications   Medication Sig Start Date End Date Taking? Authorizing Provider  azithromycin  (ZITHROMAX ) 250 MG tablet Take 1 tablet (250 mg total) by mouth daily. Take first 2 tablets together, then 1 every day until finished. 11/02/23  Yes Cyncere Sontag R, NP  escitalopram  (LEXAPRO ) 10 MG tablet Take 1 tablet (10 mg total) by mouth daily. for anxiety and depression. 09/05/23  Yes Gretta Comer POUR, NP  lidocaine  (XYLOCAINE ) 2 % solution Use as directed 15 mLs in the mouth or throat as needed. 11/02/23  Yes Najmah Carradine R, NP  ondansetron  (ZOFRAN -ODT) 8 MG disintegrating tablet Take 1 tablet (8 mg total) by mouth every 8 (eight) hours as needed for nausea or vomiting. 05/27/23   Randol Simmonds, MD  pantoprazole  (PROTONIX ) 40 MG tablet Take 1 tablet (40 mg total) by mouth daily. 05/27/23   Randol Simmonds,  MD  sucralfate  (CARAFATE ) 1 g tablet Take 1 tablet (1 g total) by mouth 4 (four) times daily -  with meals and at bedtime. Patient not taking: Reported on 06/09/2023 05/27/23   Randol Simmonds, MD  tretinoin (RETIN-A) 0.05 % cream SMARTSIG:Sparingly Topical Every Night PRN 03/29/23   [provider]    Family History Family History  Problem Relation Age of Onset   Thyroid  disease Mother    Thyroid  disease Maternal Grandmother    Diabetes Maternal Grandfather     Social History Social History   Tobacco Use   Smoking status: Never   Smokeless tobacco: Never  Substance Use Topics   Alcohol use: Yes    Comment: rarely   Drug use: No     Allergies   Patient has no known allergies.   Review of Systems Review of Systems   Physical Exam Triage Vital Signs ED Triage Vitals  Encounter Vitals Group     BP 11/02/23 1951 114/82     Systolic BP Percentile --      Diastolic BP Percentile --      Pulse Rate 11/02/23 1951 74  Resp 11/02/23 1951 18     Temp 11/02/23 1951 98.6 F (37 C)     Temp Source 11/02/23 1951 Oral     SpO2 11/02/23 1951 98 %     Weight --      Height --      Head Circumference --      Peak Flow --      Pain Score 11/02/23 1952 5     Pain Loc --      Pain Education --      Exclude from Growth Chart --    No data found.  Updated Vital Signs BP 114/82 (BP Location: Left Arm)   Pulse 74   Temp 98.6 F (37 C) (Oral)   Resp 18   LMP 10/20/2023 (Exact Date)   SpO2 98%   Visual Acuity Right Eye Distance:   Left Eye Distance:   Bilateral Distance:    Right Eye Near:   Left Eye Near:    Bilateral Near:     Physical Exam Constitutional:      Appearance: Normal appearance.  HENT:     Head: Normocephalic.     Right Ear: Tympanic membrane, ear canal and external ear normal.     Left Ear: Tympanic membrane, ear canal and external ear normal.     Nose: Congestion present. No rhinorrhea.     Mouth/Throat:     Pharynx: No oropharyngeal  exudate or posterior oropharyngeal erythema.     Tonsils: Tonsillar exudate present. 1+ on the right. 1+ on the left.     Comments: Very mild erythema to the oropharynx Eyes:     Extraocular Movements: Extraocular movements intact.  Cardiovascular:     Rate and Rhythm: Normal rate and regular rhythm.     Pulses: Normal pulses.     Heart sounds: Normal heart sounds.  Pulmonary:     Effort: Pulmonary effort is normal.     Breath sounds: Normal breath sounds.  Musculoskeletal:     Cervical back: Normal range of motion and neck supple.  Neurological:     Mental Status: She is alert and oriented to person, place, and time. Mental status is at baseline.      UC Treatments / Results  Labs (all labs ordered are listed, but only abnormal results are displayed) Labs Reviewed  POCT RAPID STREP A (OFFICE) - Normal    EKG   Radiology No results found.  Procedures Procedures (including critical care time)  Medications Ordered in UC Medications - No data to display  Initial Impression / Assessment and Plan / UC Course  I have reviewed the triage vital signs and the nursing notes.  Pertinent labs & imaging results that were available during my care of the patient were reviewed by me and considered in my medical decision making (see chart for details).  Acute URI  Patient is in no signs of distress nor toxic appearing.  Vital signs are stable.  Low suspicion for pneumonia, pneumothorax or bronchitis and therefore will defer imaging.  Rapid strep test negative, symptoms present for 7 days, now worsening prescribed azithromycin  and viscous lidocaine .May use additional over-the-counter medications as needed for supportive care.  May follow-up with urgent care as needed if symptoms persist or worsen.  Final Clinical Impressions(s) / UC Diagnoses   Final diagnoses:  Acute URI     Discharge Instructions      symptoms present for 7 days and have begun to worsen therefore begin  azithromycin  to provide coverage for  bacteria, take as directed  Strep test negative You may gargle and spit lidocaine  solution as needed for temporary relief of your throat pain    You can take Tylenol  and/or Ibuprofen  as needed for fever reduction and pain relief.   For cough: honey 1/2 to 1 teaspoon (you can dilute the honey in water or another fluid).  You can also use guaifenesin and dextromethorphan for cough. You can use a humidifier for chest congestion and cough.  If you don't have a humidifier, you can sit in the bathroom with the hot shower running.      For sore throat: try warm salt water gargles, cepacol lozenges, throat spray, warm tea or water with lemon/honey, popsicles or ice, or OTC cold relief medicine for throat discomfort.   For congestion: take a daily anti-histamine like Zyrtec, Claritin, and a oral decongestant, such as pseudoephedrine.  You can also use Flonase 1-2 sprays in each nostril daily.   It is important to stay hydrated: drink plenty of fluids (water, gatorade/powerade/pedialyte, juices, or teas) to keep your throat moisturized and help further relieve irritation/discomfort.    ED Prescriptions     Medication Sig Dispense Auth. Provider   azithromycin  (ZITHROMAX ) 250 MG tablet Take 1 tablet (250 mg total) by mouth daily. Take first 2 tablets together, then 1 every day until finished. 6 tablet Kenly Xiao R, NP   lidocaine  (XYLOCAINE ) 2 % solution Use as directed 15 mLs in the mouth or throat as needed. 100 mL Teresa Shelba SAUNDERS, NP      PDMP not reviewed this encounter.   Teresa Shelba SAUNDERS, [name] 11/03/23 [phone]

## 2023-12-21 ENCOUNTER — Other Ambulatory Visit: Payer: Self-pay | Admitting: Primary Care

## 2023-12-21 DIAGNOSIS — F4323 Adjustment disorder with mixed anxiety and depressed mood: Secondary | ICD-10-CM

## 2024-02-29 DIAGNOSIS — Z01419 Encounter for gynecological examination (general) (routine) without abnormal findings: Secondary | ICD-10-CM | POA: Diagnosis not present

## 2024-02-29 DIAGNOSIS — Z681 Body mass index (BMI) 19 or less, adult: Secondary | ICD-10-CM | POA: Diagnosis not present

## 2024-02-29 DIAGNOSIS — Z124 Encounter for screening for malignant neoplasm of cervix: Secondary | ICD-10-CM | POA: Diagnosis not present

## 2024-04-13 ENCOUNTER — Encounter: Payer: Self-pay | Admitting: Primary Care

## 2024-04-13 ENCOUNTER — Ambulatory Visit (INDEPENDENT_AMBULATORY_CARE_PROVIDER_SITE_OTHER): Admitting: Primary Care

## 2024-04-13 VITALS — BP 118/70 | HR 65 | Temp 97.4°F | Ht 67.0 in | Wt 116.0 lb

## 2024-04-13 DIAGNOSIS — F4323 Adjustment disorder with mixed anxiety and depressed mood: Secondary | ICD-10-CM | POA: Diagnosis not present

## 2024-04-13 MED ORDER — VENLAFAXINE HCL ER 37.5 MG PO CP24
37.5000 mg | ORAL_CAPSULE | Freq: Every day | ORAL | 0 refills | Status: DC
Start: 2024-04-13 — End: 2024-04-23

## 2024-04-13 NOTE — Patient Instructions (Addendum)
 You will either be contacted via phone regarding your referral to therapy and psychiatry, or you may receive a letter on your MyChart portal from our referral team with instructions for scheduling an appointment. Please let us  know if you have not been contacted by anyone within two weeks.  Start venlafaxine ER 37.5 mg capsules once daily for anxiety/depression.  Please schedule a follow-up visit for 2 weeks.  It was a pleasure to see you today!

## 2024-04-13 NOTE — Assessment & Plan Note (Signed)
 Uncontrolled.  Apparently Lexapro  was ineffective.  We discussed to notify me if this is the case moving forward.  Start venlafaxine ER 37.5 mg daily as she has failed 3 SSRI medications.  Referral placed for therapy.  Referral placed for psychiatry as she has failed 3 medications with PCP.  Close follow-up in 2 weeks. ED precautions provided.

## 2024-04-13 NOTE — Telephone Encounter (Signed)
 Please schedule patient for an office visit. Virtual or in person is fine.

## 2024-04-13 NOTE — Telephone Encounter (Signed)
 Patient evaluated.

## 2024-04-13 NOTE — Progress Notes (Signed)
 Subjective:    Patient ID: Nicole Huff, female    DOB: 1991/06/02, 33 y.o.   MRN: [id number]  Anxiety Symptoms include nervous/anxious behavior, palpitations and shortness of breath.      Nicole Huff is a very pleasant 33 y.o. female with a history of anxiety and depression who presents today to discuss anxiety depression.  She contacted us  via MyChart this morning feeling depressed regarding issues from her personal life. She denied wanting to harm herself but did mention finding it hard ot have a reason to live so she was asked to come in for an appointment.   Today she discusses ongoing symptoms of feeling sad/down, worrying, feeling anxious. She never felt like the Lexapro  has helped with her chronic anxiety and depression.   Over the last 2 months she's felt increased symptoms. Her ex boyfriend has caused credit card debt which has become overwhelming leaving her feeling betrayed. She was also evicted from her home. She is living with her mother and family now. She also feels alone as she is not close to family members. She does have friends but is the stronger one in her friend group.  She was meeting with a therapist but stopped going as she didn't feel like it was helpful. She denies a plan to harm herself but does feel like it's hard to live.   She was previously managed on Zoloft , citalopram , and Lexapro  and had no response or improvement with symptoms.  She denies bad effects. She is able to go to work during the work week which has been a helpful distraction.  She has never seen psychiatry.  Review of Systems  Respiratory:  Positive for shortness of breath.   Cardiovascular:  Positive for palpitations.  Psychiatric/Behavioral:  Positive for sleep disturbance. The patient is nervous/anxious.        See HPI         Past Medical History:  Diagnosis Date   Medical history non-contributory    Nausea vomiting and diarrhea 02/13/2021    Social History    Socioeconomic History   Marital status: Single    Spouse name: Not on file   Number of children: Not on file   Years of education: Not on file   Highest education level: Not on file  Occupational History   Not on file  Tobacco Use   Smoking status: Never   Smokeless tobacco: Never  Substance and Sexual Activity   Alcohol use: Yes    Comment: rarely   Drug use: No   Sexual activity: Yes    Birth control/protection: Implant, Pill  Other Topics Concern   Not on file  Social History Narrative   Not on file   Social Drivers of Health   Financial Resource Strain: Not on file  Food Insecurity: Not on file  Transportation Needs: Not on file  Physical Activity: Not on file  Stress: Not on file  Social Connections: Not on file  Intimate Partner Violence: Not on file    Past Surgical History:  Procedure Laterality Date   NO PAST SURGERIES      Family History  Problem Relation Age of Onset   Thyroid disease Mother    Thyroid disease Maternal Grandmother    Diabetes Maternal Grandfather     No Known Allergies  Current Outpatient Medications on File Prior to Visit  Medication Sig Dispense Refill   lidocaine  (XYLOCAINE ) 2 % solution Use as directed 15 mLs in the mouth or throat as  needed. 100 mL 0   ondansetron  (ZOFRAN -ODT) 8 MG disintegrating tablet Take 1 tablet (8 mg total) by mouth every 8 (eight) hours as needed for nausea or vomiting. 12 tablet 0   pantoprazole  (PROTONIX ) 40 MG tablet Take 1 tablet (40 mg total) by mouth daily. 30 tablet 0   tretinoin (RETIN-A) 0.05 % cream SMARTSIG:Sparingly Topical Every Night PRN (Patient not taking: Reported on 04/13/2024)     No current facility-administered medications on file prior to visit.    BP 118/70   Pulse 65   Temp (!) 97.4 F (36.3 C) (Temporal)   Ht 5' 7 (1.702 m)   Wt 116 lb (52.6 kg)   LMP 03/28/2024   SpO2 99%   BMI 18.17 kg/m  Objective:   Physical Exam  Cardiovascular:     Rate and Rhythm: Normal  rate and regular rhythm.  Pulmonary:     Effort: Pulmonary effort is normal.     Breath sounds: Normal breath sounds.   Musculoskeletal:     Cervical back: Neck supple.   Skin:    General: Skin is warm and dry.   Neurological:     Mental Status: She is alert and oriented to person, place, and time.   Psychiatric:     Comments: Tearful during visit           Assessment & Plan:  Adjustment reaction with anxiety and depression Assessment & Plan: Uncontrolled.  Apparently Lexapro  was ineffective.  We discussed to notify me if this is the case moving forward.  Start venlafaxine ER 37.5 mg daily as she has failed 3 SSRI medications.  Referral placed for therapy.  Referral placed for psychiatry as she has failed 3 medications with PCP.  Close follow-up in 2 weeks. ED precautions provided.  Orders: -     Ambulatory referral to Psychiatry -     Ambulatory referral to Psychology -     Venlafaxine HCl ER; Take 1 capsule (37.5 mg total) by mouth daily with breakfast. for anxiety and depression.  Dispense: 90 capsule; Refill: 0        Gabriel John, NP

## 2024-04-19 ENCOUNTER — Inpatient Hospital Stay (HOSPITAL_COMMUNITY)
Admission: AD | Admit: 2024-04-19 | Discharge: 2024-04-23 | DRG: 885 | Disposition: A | Discharge status: Home / Self Care | Source: Intra-hospital | Attending: Psychiatry | Admitting: Psychiatry

## 2024-04-19 ENCOUNTER — Ambulatory Visit (HOSPITAL_COMMUNITY)
Admission: EM | Admit: 2024-04-19 | Discharge: 2024-04-19 | Disposition: A | Discharge status: Other Acute Inpatient Hospital | Attending: Psychiatry | Admitting: Psychiatry

## 2024-04-19 ENCOUNTER — Other Ambulatory Visit: Payer: Self-pay

## 2024-04-19 ENCOUNTER — Encounter (HOSPITAL_COMMUNITY): Payer: Self-pay | Admitting: Psychiatry

## 2024-04-19 DIAGNOSIS — G4700 Insomnia, unspecified: Secondary | ICD-10-CM | POA: Diagnosis not present

## 2024-04-19 DIAGNOSIS — F4323 Adjustment disorder with mixed anxiety and depressed mood: Secondary | ICD-10-CM | POA: Diagnosis present

## 2024-04-19 DIAGNOSIS — Z79899 Other long term (current) drug therapy: Secondary | ICD-10-CM | POA: Diagnosis not present

## 2024-04-19 DIAGNOSIS — R45851 Suicidal ideations: Secondary | ICD-10-CM | POA: Diagnosis present

## 2024-04-19 DIAGNOSIS — F419 Anxiety disorder, unspecified: Secondary | ICD-10-CM | POA: Insufficient documentation

## 2024-04-19 DIAGNOSIS — Z5982 Transportation insecurity: Secondary | ICD-10-CM | POA: Diagnosis not present

## 2024-04-19 DIAGNOSIS — Z9151 Personal history of suicidal behavior: Secondary | ICD-10-CM | POA: Insufficient documentation

## 2024-04-19 DIAGNOSIS — F332 Major depressive disorder, recurrent severe without psychotic features: Secondary | ICD-10-CM | POA: Insufficient documentation

## 2024-04-19 LAB — CBC WITH DIFFERENTIAL/PLATELET
Abs Immature Granulocytes: 0.02 10*3/uL (ref 0.00–0.07)
Basophils Absolute: 0 10*3/uL (ref 0.0–0.1)
Basophils Relative: 1 %
Eosinophils Absolute: 0.1 10*3/uL (ref 0.0–0.5)
Eosinophils Relative: 2 %
HCT: 39.1 % (ref 36.0–46.0)
Hemoglobin: 13.1 g/dL (ref 12.0–15.0)
Immature Granulocytes: 0 %
Lymphocytes Relative: 16 %
Lymphs Abs: 0.9 10*3/uL (ref 0.7–4.0)
MCH: 28.1 pg (ref 26.0–34.0)
MCHC: 33.5 g/dL (ref 30.0–36.0)
MCV: 83.9 fL (ref 80.0–100.0)
Monocytes Absolute: 0.3 10*3/uL (ref 0.1–1.0)
Monocytes Relative: 6 %
Neutro Abs: 4.3 10*3/uL (ref 1.7–7.7)
Neutrophils Relative %: 75 %
Platelets: 183 10*3/uL (ref 150–400)
RBC: 4.66 MIL/uL (ref 3.87–5.11)
RDW: 13.8 % (ref 11.5–15.5)
WBC: 5.7 10*3/uL (ref 4.0–10.5)
nRBC: 0 % (ref 0.0–0.2)

## 2024-04-19 LAB — POCT URINE DRUG SCREEN - MANUAL ENTRY (I-SCREEN)
POC Amphetamine UR: NOT DETECTED
POC Buprenorphine (BUP): NOT DETECTED
POC Cocaine UR: NOT DETECTED
POC Marijuana UR: NOT DETECTED
POC Methadone UR: NOT DETECTED
POC Methamphetamine UR: NOT DETECTED
POC Morphine: NOT DETECTED
POC Oxazepam (BZO): NOT DETECTED
POC Oxycodone UR: NOT DETECTED
POC Secobarbital (BAR): NOT DETECTED

## 2024-04-19 LAB — LIPID PANEL
Cholesterol: 191 mg/dL (ref 0–200)
HDL: 45 mg/dL (ref >40)
LDL Cholesterol: 136 mg/dL — ABNORMAL HIGH (ref 0–99)
Total CHOL/HDL Ratio: 4.2 ratio
Triglycerides: 50 mg/dL (ref <150)
VLDL: 10 mg/dL (ref 0–40)

## 2024-04-19 LAB — COMPREHENSIVE METABOLIC PANEL WITH GFR
ALT: 12 U/L (ref 0–44)
AST: 16 U/L (ref 15–41)
Albumin: 4.4 g/dL (ref 3.5–5.0)
Alkaline Phosphatase: 44 U/L (ref 38–126)
Anion gap: 13 (ref 5–15)
BUN: 10 mg/dL (ref 6–20)
CO2: 22 mmol/L (ref 22–32)
Calcium: 9.4 mg/dL (ref 8.9–10.3)
Chloride: 103 mmol/L (ref 98–111)
Creatinine, Ser: 0.77 mg/dL (ref 0.44–1.00)
GFR, Estimated: >60 mL/min (ref >60)
Glucose, Bld: 63 mg/dL — ABNORMAL LOW (ref 70–99)
Potassium: 4 mmol/L (ref 3.5–5.1)
Sodium: 138 mmol/L (ref 135–145)
Total Bilirubin: 1.4 mg/dL — ABNORMAL HIGH (ref 0.0–1.2)
Total Protein: 7.2 g/dL (ref 6.5–8.1)

## 2024-04-19 LAB — TSH: TSH: 0.939 u[IU]/mL (ref 0.350–4.500)

## 2024-04-19 LAB — HEMOGLOBIN A1C
Hgb A1c MFr Bld: 4.6 % — ABNORMAL LOW (ref 4.8–5.6)
Mean Plasma Glucose: 85.32 mg/dL

## 2024-04-19 LAB — ETHANOL: Alcohol, Ethyl (B): <15 mg/dL (ref <15)

## 2024-04-19 LAB — POC URINE PREG, ED: Preg Test, Ur: NEGATIVE

## 2024-04-19 MED ORDER — ACETAMINOPHEN 325 MG PO TABS: 650.0000 mg | ORAL_TABLET | Freq: Four times a day (QID) | ORAL | Status: DC | PRN

## 2024-04-19 MED ORDER — DIPHENHYDRAMINE HCL 25 MG PO CAPS: 50.0000 mg | ORAL_CAPSULE | Freq: Three times a day (TID) | ORAL | Status: DC | PRN

## 2024-04-19 MED ORDER — MAGNESIUM HYDROXIDE 400 MG/5ML PO SUSP: 30.0000 mL | Freq: Every day | ORAL | Status: DC | PRN

## 2024-04-19 MED ORDER — LORAZEPAM 2 MG/ML IJ SOLN: 2.0000 mg | Freq: Three times a day (TID) | INTRAMUSCULAR | Status: DC | PRN

## 2024-04-19 MED ORDER — HYDROXYZINE HCL 25 MG PO TABS
25.0000 mg | ORAL_TABLET | Freq: Three times a day (TID) | ORAL | Status: DC | PRN
  Administered 2024-04-19: 25 mg via ORAL
  Filled 2024-04-19 (×4): qty 1

## 2024-04-19 MED ORDER — DIPHENHYDRAMINE HCL 50 MG PO CAPS: 50.0000 mg | ORAL_CAPSULE | Freq: Three times a day (TID) | ORAL | Status: DC | PRN

## 2024-04-19 MED ORDER — VENLAFAXINE HCL ER 37.5 MG PO CP24: 37.5000 mg | ORAL_CAPSULE | Freq: Every day | ORAL | Status: DC

## 2024-04-19 MED ORDER — DIPHENHYDRAMINE HCL 50 MG/ML IJ SOLN: 50.0000 mg | Freq: Three times a day (TID) | INTRAMUSCULAR | Status: DC | PRN

## 2024-04-19 MED ORDER — HALOPERIDOL 5 MG PO TABS: 5.0000 mg | ORAL_TABLET | Freq: Three times a day (TID) | ORAL | Status: DC | PRN

## 2024-04-19 MED ORDER — VENLAFAXINE HCL ER 37.5 MG PO CP24
37.5000 mg | ORAL_CAPSULE | Freq: Every day | ORAL | Status: DC
  Administered 2024-04-19 – 2024-04-20 (×2): 37.5 mg via ORAL
  Filled 2024-04-19 (×2): qty 1

## 2024-04-19 MED ORDER — VENLAFAXINE HCL ER 37.5 MG PO CP24
37.5000 mg | ORAL_CAPSULE | Freq: Every day | ORAL | Status: DC
Start: 2024-04-19 — End: 2024-04-19

## 2024-04-19 MED ORDER — ALUM & MAG HYDROXIDE-SIMETH 200-200-20 MG/5ML PO SUSP: 30.0000 mL | ORAL | Status: DC | PRN

## 2024-04-19 MED ORDER — ACETAMINOPHEN 325 MG PO TABS
650.0000 mg | ORAL_TABLET | Freq: Four times a day (QID) | ORAL | Status: DC | PRN
  Administered 2024-04-22 (×3): 650 mg via ORAL
  Filled 2024-04-19 (×3): qty 2

## 2024-04-19 MED ORDER — HYDROXYZINE HCL 25 MG PO TABS: 25.0000 mg | ORAL_TABLET | Freq: Three times a day (TID) | ORAL | Status: DC | PRN

## 2024-04-19 MED ORDER — HALOPERIDOL LACTATE 5 MG/ML IJ SOLN: 5.0000 mg | Freq: Three times a day (TID) | INTRAMUSCULAR | Status: DC | PRN

## 2024-04-19 MED ORDER — TRAZODONE HCL 50 MG PO TABS: 50.0000 mg | ORAL_TABLET | Freq: Every evening | ORAL | Status: DC | PRN

## 2024-04-19 MED ORDER — HALOPERIDOL LACTATE 5 MG/ML IJ SOLN: 10.0000 mg | Freq: Three times a day (TID) | INTRAMUSCULAR | Status: DC | PRN

## 2024-04-19 MED ORDER — TRAZODONE HCL 50 MG PO TABS
50.0000 mg | ORAL_TABLET | Freq: Every evening | ORAL | Status: DC | PRN
  Administered 2024-04-19 – 2024-04-22 (×4): 50 mg via ORAL
  Filled 2024-04-19 (×4): qty 1

## 2024-04-19 NOTE — ED Provider Notes (Signed)
 Behavioral Health Urgent Care Medical Screening Exam  Patient Name: Nicole Huff MRN: [id number] Date of Evaluation: 04/19/24 Chief Complaint:  I have been thinking about killing myself Diagnosis:  Final diagnoses:  Severe episode of recurrent major depressive disorder, without psychotic features (HCC)  Suicidal ideation    History of Present illness:Nicole Huff 33 y.o., female patient presented to [facility] as a voluntary walk in accompanied by her mother  with complaints of suicidal ideations with plan to hang herself. Nicole Huff, is seen face to face by this provider, consulted with Dr. Leigh; and chart reviewed on 04/19/24.  Per chart reviewed pt has a PPHx of anxiety and depression.  She switched from Lexapro  to Effexor  37.5 mg nightly about 1 week ago and started taking this medication about 5 days ago.  She is currently being treated by her PCP but does have a psychiatry on 05/10/24 with [facility].  No significant medical history.  On evaluation Nicole Huff reports that she has struggled with anxiety and depression for a while and has tried multiple different medications but they have been ineffective.  Patient states that she has most recently switched from Lexapro  to venlafaxine  by her PCP and referred to psychiatry.  Patient reports that last week she found out that her boyfriend of 1-1/2 years has been cheating for the past month.  She states that she found out through phone records since he is on her phone plan.  She states that when she confronted him about it, he became very mean making statements such as: He was using her, he needed money, he has been lying to her their whole relationship, playing her and has since blocked her on all social media and the phone.  Patient states that she has tried to make a difference social media account and has tried to change her number so that he would answer the phone or respond to her but he continues to change his  social media accounts or blocked the number if she is using.  Patient reports that she has had racing thoughts and feeling very down and sad this past week.  She states that last night she tied up some belts together with a plan to hang herself from the ceiling fan but reports being too scared to try it.  She also reports that she has not eaten in 2 days purposely hoping that she would just go to sleep and not wake up.  She endorses having a prior suicide attempt 2 years ago while attempting to overdose but states I did not take enough so nothing happened.  Patient stated this is also the last time she received therapy and when she began taking antidepressant.  Patient currently lives at home with mom, stepdad, stepsister/child and grandmother.  She currently works full-time as a Chief Executive Officer and states that she attempted to go to work this morning but was unable to keep her emotions together.  She denies substance use.  We discussed the recommendation for her to receive inpatient psychiatric hospitalization and she is agreeable to this plan.  We also discussed continuing her Effexor  for mood.  During evaluation Nicole Huff is sitting up in assessment room, in no acute distress. She is alert & oriented x 4, calm, cooperative and attentive for this assessment.  Her mood is depressed with congruent tearful affect.  She has normal speech, and behavior.  Objectively there is no evidence of psychosis/mania or delusional thinking. Pt does not  appear to be responding to internal or external stimuli.  Patient is able to converse coherently, goal directed thoughts, no distractibility, or pre-occupation.  Patient continues to endorse having suicidal ideations with thoughts of hanging herself or drinking antifreeze.  She denies current homicidal ideation, psychosis, and paranoia.  Patient answered question appropriately.    Flowsheet Row ED from 04/19/2024 in [facility] UC from  11/02/2023 in [facility] at [facility]  ED from 05/27/2023 in [facility] Emergency Department at [facility]  C-SSRS RISK CATEGORY High Risk No Risk No Risk    Psychiatric Specialty Exam  Presentation  General Appearance:Casual  Eye Contact:Fair  Speech:Clear and Coherent  Speech Volume:Normal  Handedness:Right   Mood and Affect  Mood: Depressed; Dysphoric; Hopeless; Worthless  Affect: Congruent; Depressed; Tearful   Thought Process  Thought Processes: Coherent  Descriptions of Associations:Intact  Orientation:Full (Time, Place and Person)  Thought Content:WDL  Diagnosis of Schizophrenia or Schizoaffective disorder in past: No data recorded  Hallucinations:None  Ideas of Reference:None  Suicidal Thoughts:Yes, Active With Intent; With Plan  Homicidal Thoughts:No   Sensorium  Memory: Recent Fair; Immediate Good  Judgment: Fair  Insight: Fair   Chartered certified accountant: Fair  Attention Span: Fair  Recall: [employer] of Knowledge: Fair  Language: Fair   Psychomotor Activity  Psychomotor Activity: Normal   Assets  Assets: Manufacturing systems engineer; Desire for Improvement; Financial Resources/Insurance; Housing; Physical Health; Resilience; Social Support; Vocational/Educational; Transportation   Sleep  Sleep: Poor  Number of hours:  4   Physical Exam: Physical Exam Vitals and nursing note reviewed.  Constitutional:      Appearance: Normal appearance.  HENT:     Head: Normocephalic.     Nose: Nose normal.   Eyes:     Extraocular Movements: Extraocular movements intact.    Cardiovascular:     Rate and Rhythm: Normal rate.  Pulmonary:     Effort: Pulmonary effort is normal.   Musculoskeletal:        General: Normal range of motion.     Cervical back: Normal range of motion.   Neurological:     General: No focal deficit present.     Mental Status: She is alert and oriented to person,  place, and time.    Review of Systems  Constitutional: Negative.   HENT: Negative.    Eyes: Negative.   Respiratory: Negative.    Cardiovascular: Negative.   Gastrointestinal: Negative.   Genitourinary: Negative.   Musculoskeletal: Negative.   Neurological: Negative.   Endo/Heme/Allergies: Negative.   Psychiatric/Behavioral:  Positive for depression and suicidal ideas.    Blood pressure 106/73, pulse 76, temperature 98 F (36.7 C), temperature source Oral, resp. rate 16, last menstrual period 03/28/2024, SpO2 100%. There is no height or weight on file to calculate BMI.  Musculoskeletal: Strength & Muscle Tone: within normal limits Gait & Station: normal Patient leans: N/A   [facility] MSE Discharge Disposition for Follow up and Recommendations: Based on my evaluation I certify that psychiatric inpatient services furnished can reasonably be expected to improve the patient's condition which I recommend transfer to an appropriate accepting facility.  Patient is recommended for inpatient psychiatric hospitalization for safety and mood stabilization, based on her suicidal plans and acting on those thoughts.  Patient is at high risk for self-harm.  Patient remains voluntary at this time and is agreeable to receive inpatient treatment.  Treatment plan:  - Admit to continuous assessment until appropriate inpatient bed  is found. - Agitation protocol ordered per policy. - Will continue patient's venlafaxine  37.5 mg nightly with meals for mood and anxiety. - PRNs ordered for sleep and anxiety Meds ordered this encounter  Medications   DISCONTD: venlafaxine  XR (EFFEXOR -XR) 24 hr capsule 37.5 mg   venlafaxine  XR (EFFEXOR -XR) 24 hr capsule 37.5 mg   acetaminophen  (TYLENOL ) tablet 650 mg   alum & mag hydroxide-simeth (MAALOX/MYLANTA) 200-200-20 MG/5ML suspension 30 mL   magnesium hydroxide (MILK OF MAGNESIA) suspension 30 mL   AND Linked Order Group    haloperidol (HALDOL) tablet 5 mg     diphenhydrAMINE (BENADRYL) capsule 50 mg   AND Linked Order Group    haloperidol lactate (HALDOL) injection 5 mg    diphenhydrAMINE (BENADRYL) injection 50 mg    LORazepam (ATIVAN) injection 2 mg   AND Linked Order Group    haloperidol lactate (HALDOL) injection 10 mg    diphenhydrAMINE (BENADRYL) injection 50 mg    LORazepam (ATIVAN) injection 2 mg   hydrOXYzine (ATARAX) tablet 25 mg   traZODone (DESYREL) tablet 50 mg  -Labs, UDS, UPT and EKG ordered Lab Orders         CBC with Differential/Platelet         Comprehensive metabolic panel         Hemoglobin A1c         Ethanol         Lipid panel         TSH         POC urine preg, ED         POCT Urine Drug Screen - (I-Screen)     EKG  Disposition: Patient has been accepted to [facility] Alan JAYSON Mcardle, NP 04/19/2024, 12:15 PM

## 2024-04-19 NOTE — ED Notes (Signed)
 Pt presented to [facility] voluntarily and admitted to continuous assessment unit w/ c/o depression and suicidal. Hx includes, MDD, previous suicide attempts. Pt reports conflict w/ her boyfriend and that he isn't answering his phone or responding to her. Pt reports feeling depressed and that she had a plan to hang herself w/ a belt. Pt denies SI/HI/AVH at this time. Pt calm and cooperative. Pt in NAD at this time. Encouragement and support given. Will continue to monitor.

## 2024-04-19 NOTE — ED Notes (Signed)
 Pt sleeping at this time. Rise and fall of chest noted. Pt in NAD at this time. Will continue to monitor.

## 2024-04-19 NOTE — BH Assessment (Signed)
 Comprehensive Clinical Assessment (CCA) Note  04/19/2024 Nicole Huff [id number]  DISPOSITION: Per Alan Mcardle NP pt is recommended for inpatient psychiatric admission  The patient demonstrates the following risk factors for suicide: Chronic risk factors for suicide include: psychiatric disorder of MDD and previous suicide attempts in the past. Acute risk factors for suicide include: family or marital conflict and loss (financial, interpersonal, professional). Protective factors for this patient include: positive therapeutic relationship and hope for the future. Considering these factors, the overall suicide risk at this point appears to be high. Patient is appropriate for outpatient follow up.   Per Triage assessment: "Patient states that she is depressed and suicidal. She recently found out that her boyfriend of 1.5 years was seeing someone else. Patient states that he was playing her and using her. Patient states that he lied to her. Patient states that he is refusing to talk to her and has blocked her on social media and she states that she has made multiple attempts to try to reach him and even had her phone number changed to see if he would pick up. Patient states that she has been very depressed and suicidal. She states that she tried to connect all her belts together this morning in order to try to hang herself from the ceiling fan and she states that she thought about drinking anti-freeze. Patient states, I am really too scared to hurt myself. Patient states that she is not sleeping due to having racing thoughts. She states that she has not eaten in two days. Patient states that she is prescribed Lexapro  by Bennet Gaskins, but states that he recently changed her medicine to (Anafloxin?) Prozac. Patient denies any use of alcohol or drugs. Patient is emergent."  With further assessment: Pt is a 33 yo female who presented voluntarily and accompanied by her mother. She stated she presented due to  worsening depression and recent SI. Pt stated that last night she tied all her belts together planning to hang herself from a ceiling fan. She did not attempt stating, "I'm too scared to hurt myself." Pt reported one past attempt about 2 years ago via intentional overdose. Pt stated she did not take enough medication to kill her. Pt stated she has never been psychiatrically hospitalized. Pt stated that he greatest stress is coming from a recently romantic break-up with her boyfriend of 1.5 years. Pt stated she feels "used," "played" and "lied to." Pt stated that he has been seeing someone else for about a month and has blocked her from calling him. Pt stated he will not answer her calls. Pt endorsed current SI but denied HI, NSSH, AVH, paranoia and any substance use. Pt stated that she is prescribed psychiatric medication by her PCP ([place], [employer]Pt stated she has not seen an OP therapist in about 2 years since following  her last attempt.   Pt stated that she lives with her mother, stepfather, grandmother, step sister and step sister's children. Pt stated she has not been married and has no children. No hx of childhood abuse, domestic violence or exploitation. 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 Pt started that she has had to miss work due to her depressive symptoms including today. Pt stated that her sleep and appetite has decreased. Pt stated she has not eaten anything in 2 days. Pt described racing thoughts that often keep her from falling asleep.   Pt was calm, cooperative, alert and fully oriented. Pt was neatly and casually dressed  and seemed adequately groomed. Pt's speech, eye contact and movement were within normal limits. Pt's mood was depressed and her flat affect was congruent. Pt's judgment and insight both seemed impaired.    Chief Complaint:  Chief Complaint  Patient presents with   Depression   Suicidal   Visit Diagnosis:  MDD, Recurrent, Severe     CCA Screening, Triage and Referral (STR)  Patient Reported Information How did you hear about us ? Self  What Is the Reason for Your Visit/Call Today? Patient states that she is depressed and suicidal.  She recently found out that her boyfriend of 1.5 years was seeing someone else.  Patient states that he was playing her and using her.  Patient states that he lied to her.  Patient states that he is refusing to talk to her and has blocked her on social media and she states that she has made multiple attempts to try to reach him and even had her phone number changed to see if he would pick up.  Patient states that she has been very depressed and suicidal.  She states that she tried to connect all her belts together this morning in order to try to hang herself from the ceiling fan and she states that she thought about drinking anti-freeze. Patient states, I am really too scared to hurt myself.   Patient states that she is not sleeping due to having racing thoughts.  She states that she has not eaten in two days.  Patient states that she is prescribed Lexapro  by Bennet Gaskins, but states that he recently changed her medicine to (Anafloxin?)  Prozac.  Patient denies any use of alcohol or drugs.  Patient is emergent.  How Long Has This Been Causing You Problems? 1 wk - 1 month  What Do You Feel Would Help You the Most Today? Treatment for Depression or other mood problem   Have You Recently Had Any Thoughts About Hurting Yourself? Yes  Are You Planning to Commit Suicide/Harm Yourself At This time? No   Flowsheet Row ED from 04/19/2024 in [facility] UC from 11/02/2023 [facility]  ED from 05/27/2023 in [facility] Emergency Department at [facility]  C-SSRS RISK CATEGORY High Risk No Risk No Risk    Have you Recently Had Thoughts About Hurting Someone Sherral? No  Are You Planning to Harm Someone at This Time? No  Explanation: na  Have  You Used Any Alcohol or Drugs in the Past 24 Hours? No  How Long Ago Did You Use Drugs or Alcohol? na What Did You Use and How Much? na  Do You Currently Have a Therapist/Psychiatrist? No (PCP is prescribing psychiatric medications)  Name of Therapist/Psychiatrist:    Have You Been Recently Discharged From Any Office Practice or Programs? no Explanation of Discharge From Practice/Program: na    CCA Screening Triage Referral Assessment Type of Contact: Face-to-Face  Telemedicine Service Delivery:   Is this Initial or Reassessment?   Date Telepsych consult ordered in [facility]:    Time Telepsych consult ordered in [facility]:    Location of Assessment: [facility]  Provider Location: [facility] Assessment Services   Collateral Involvement: none offered   Does Patient Have a Automotive engineer Guardian? No  Legal Guardian Contact Information: na  Copy of Legal Guardianship Form: -- (na)  Legal Guardian Notified of Arrival: -- (na)  Legal Guardian Notified of Pending Discharge: -- (na)  If Minor  and Not Living with Parent(s), Who has Custody? adult  Is CPS involved or ever been involved? -- (none reported)  Is APS involved or ever been involved? -- (none reported)   Patient Determined To Be At Risk for Harm To Self or Others Based on Review of Patient Reported Information or Presenting Complaint? Yes, for Self-Harm  Method: Plan with intent and identified person  Availability of Means: Has close by  Intent: Clearly intends on inflicting harm that could cause death  Notification Required: No need or identified person  Additional Information for Danger to Others Potential: Previous attempts  Additional Comments for Danger to Others Potential: none  Are There Guns or Other Weapons in Your Home? No (denied access)  Types of Guns/Weapons: na  Are These Weapons Safely Secured?                            -- (na)  Who Could Verify You Are Able To Have These  Secured: na  Do You Have any Outstanding Charges, Pending Court Dates, Parole/Probation? none-denied  Contacted To Inform of Risk of Harm To Self or Others: -- (na)    Does Patient Present under Involuntary Commitment? No    [place] of Residence: [place]   Patient Currently Receiving the Following Services: Not Receiving Services   Determination of Need: Emergent (2 hours) (Per Alan Mcardle NP pt is recommended for inpatient psychiatric admission)   Options For Referral: Inpatient Hospitalization     CCA Biopsychosocial Patient Reported Schizophrenia/Schizoaffective Diagnosis in Past: No   Strengths: self-awareness and able to ask for and accept help   Mental Health Symptoms Depression:  Change in energy/activity; Increase/decrease in appetite; Worthlessness; Sleep (too much or little); Fatigue; Hopelessness; Tearfulness   Duration of Depressive symptoms: Duration of Depressive Symptoms: Greater than two weeks   Mania:  None   Anxiety:   Fatigue; Restlessness; Sleep; Worrying   Psychosis:  None   Duration of Psychotic symptoms:    Trauma:  None   Obsessions:  None   Compulsions:  None   Inattention:  None   Hyperactivity/Impulsivity:  N/A   Oppositional/Defiant Behaviors:  None   Emotional Irregularity:  None   Other Mood/Personality Symptoms:  none    Mental Status Exam Appearance and self-care  Stature:  Average   Weight:  Thin   Clothing:  Neat/clean   Grooming:  Normal   Cosmetic use:  None   Posture/gait:  Normal   Motor activity:  Not Remarkable   Sensorium  Attention:  Normal   Concentration:  Normal   Orientation:  X5   Recall/memory:  Normal   Affect and Mood  Affect:  Anxious; Depressed; Flat   Mood:  Anxious; Depressed; Dysphoric; Pessimistic   Relating  Eye contact:  Normal   Facial expression:  Anxious; Depressed   Attitude toward examiner:  Cooperative   Thought and Language  Speech flow: Clear and  Coherent   Thought content:  Appropriate to Mood and Circumstances   Preoccupation:  None   Hallucinations:  None   Organization:  Coherent   [employer] of Knowledge:  Average   Intelligence:  Average   Abstraction:  Normal   Judgement:  Impaired   Reality Testing:  Adequate   Insight:  Lacking   Decision Making:  Impulsive; Vacilates   Social Functioning  Social Maturity:  Responsible   Social Judgement:  Normal   Stress  Stressors:  Relationship  Coping Ability:  Overwhelmed; Exhausted   Skill Deficits:  Self-care   Supports:  Family; Support needed     Religion: Religion/Spirituality Are You A Religious Person?: No How Might This Affect Treatment?: unknown  Leisure/Recreation: Leisure / Recreation Do You Have Hobbies?: Yes Leisure and Hobbies: was riding 4-wheelers with boyfriend in the past  Exercise/Diet: Exercise/Diet Do You Exercise?: No Have You Gained or Lost A Significant Amount of Weight in the Past Six Months?: No Do You Follow a Special Diet?: No Do You Have Any Trouble Sleeping?: Yes Explanation of Sleeping Difficulties: varies   CCA Employment/Education Employment/Work Situation: Employment / Work Situation Employment Situation: Employed Work Stressors: none reported Patient's Job has Been Impacted by Current Illness: No Has Patient ever Been in Equities trader?: No  Education: Education Is Patient Currently Attending School?: No Last Grade Completed: 14 Did You Product manager?: Yes What Type of College Degree Do you Have?: medical Agricultural consultant) Did You Have An Individualized Education Program (IIEP): No Did You Have Any Difficulty At School?: No   CCA Family/Childhood History Family and Relationship History: Family history Marital status: Single Does patient have children?: No  Childhood History:  Childhood History By whom was/is the patient raised?: Mother, Psychologist, occupational and step-parent,  Grandparents, Other (Comment) (grandmother, step sister and step sister's children) Did patient suffer any verbal/emotional/physical/sexual abuse as a child?: No Has patient ever been sexually abused/assaulted/raped as an adolescent or adult?: No Witnessed domestic violence?: No Has patient been affected by domestic violence as an adult?: No       CCA Substance Use Alcohol/Drug Use: Alcohol / Drug Use Pain Medications: see MAR Prescriptions: see MAR Over the Counter: see MAR History of alcohol / drug use?: No history of alcohol / drug abuse                         ASAM's:  Six Dimensions of Multidimensional Assessment  Dimension 1:  Acute Intoxication and/or Withdrawal Potential:      Dimension 2:  Biomedical Conditions and Complications:      Dimension 3:  Emotional, Behavioral, or Cognitive Conditions and Complications:     Dimension 4:  Readiness to Change:     Dimension 5:  Relapse, Continued use, or Continued Problem Potential:     Dimension 6:  Recovery/Living Environment:     ASAM Severity Score:    ASAM Recommended Level of Treatment:     Substance use Disorder (SUD)    Recommendations for Services/Supports/Treatments: Recommendations for Services/Supports/Treatments Recommendations For Services/Supports/Treatments: Individual Therapy, Medication Management  Disposition Recommendation per psychiatric provider: We recommend inpatient psychiatric hospitalization when medically cleared. Patient is under voluntary admission status at this time; please IVC if attempts to leave hospital.   DSM5 Diagnoses: Patient Active Problem List   Diagnosis Date Noted   Epigastric pain 11/12/2021   Adjustment reaction with anxiety and depression 02/20/2021     Referrals to Alternative Service(s): Referred to Alternative Service(s):   Place:   Date:   Time:    Referred to Alternative Service(s):   Place:   Date:   Time:    Referred to Alternative Service(s):    Place:   Date:   Time:    Referred to Alternative Service(s):   Place:   Date:   Time:     Euclide Granito T, Counselor

## 2024-04-19 NOTE — Discharge Instructions (Signed)
 Transfer to [facility] for inpt treatment

## 2024-04-19 NOTE — Progress Notes (Signed)
 Pt has been accepted to [facility] on 04/18/2024 Bed assignment: 403-2  Pt meets inpatient criteria per: Alan Mcardle NP  Attending Physician will be: Dr. Prentis    Report can be called to: Adult unit: [phone]  Pt can arrive after (pending items are received/pending discharges) Consents   Care Team Notified: [facility] Bretta Qua RN, Damien Fireman RN, Alan Mcardle NP  [place] Alayha Babineaux LCSW-A   04/19/2024 2:47 PM

## 2024-04-19 NOTE — Plan of Care (Signed)

## 2024-04-19 NOTE — Plan of Care (Signed)
 Nurse discussed anxiety, depression and coping skills with patient.

## 2024-04-19 NOTE — Progress Notes (Signed)
 Patient is 33 yr old female, stated she had been at [facility] previously but did not remember when.  Has boyfriend problems, Things good, trying to get back together, planned to go see him at his request.  She found out he has been seeing another girl and now he won't talk to her.  Been separated, working two months to try to get back together.  One week has really been bad.  Made decision to hurt myself.  Was SI in past three years, overdosed on Midol , but did not take enough pills.  Her family does not have history of mental problems and no drugs, alcohol.  Stated she lives with her mother in [place] and will return to her mother's home after [facility] discharge.    Tattoos on L ankle, R side, abdomen, back.  One small tattoo on L side/back.  Belly ring.  Tattoo on L wrist and L neck.   Staff:  [place] PEAK and Rojelio RN. Denied tobacco, alcohol, THC, heroin, cocaine.  Rated depression 9, hopeless 8, anxiety 6.  No history of falls. Fall risk information given and discussed with patient who stated she understood.  Patient is low fall risk.   Has some college, graduated from high school.  Has panic attacks, feels old, life is over, will never have children.  Stressors, boyfriend problems, money, anxiety, depression, panic attacks.   Has lost 5 lbs in one week.  No surgeries, single, no children.  Works at [employer] two years.  Takes effexor  37.5 mg four days.  Mallie Gaskins, MD, [place], [place]. Patient is pleasant, cooperative and quiet.

## 2024-04-19 NOTE — Tx Team (Signed)
 Initial Treatment Plan 04/19/2024 7:18 PM XINYI BATTON FMW:[id number]    PATIENT STRESSORS: Financial difficulties   Marital or family conflict   Traumatic event     PATIENT STRENGTHS: Ability for insight  Average or above average intelligence  Capable of independent living  Communication skills  General fund of knowledge  Motivation for treatment/growth  Physical Health  Supportive family/friends  Work skills    PATIENT IDENTIFIED PROBLEMS: Anxiety  Depression  Panic  Suicide thoughts               DISCHARGE CRITERIA:  Ability to meet basic life and health needs Adequate post-discharge living arrangements Improved stabilization in mood, thinking, and/or behavior Motivation to continue treatment in a less acute level of care Need for constant or close observation no longer present Reduction of life-threatening or endangering symptoms to within safe limits Safe-care adequate arrangements made Verbal commitment to aftercare and medication compliance  PRELIMINARY DISCHARGE PLAN: Attend aftercare/continuing care group Attend PHP/IOP Outpatient therapy Return to previous living arrangement Return to previous work or school arrangements  PATIENT/FAMILY INVOLVEMENT: This treatment plan has been presented to and reviewed with the patient, Libbi A Stemmler..  The patient and family have been given the opportunity to ask questions and make suggestions.  Anice Line [place], [place] 04/19/2024, 7:18 PM

## 2024-04-19 NOTE — Progress Notes (Signed)
 Nezzie did not attend wrap up group

## 2024-04-19 NOTE — Progress Notes (Signed)
   04/19/24 2300  Psych Admission Type (Psych Patients Only)  Admission Status Voluntary  Psychosocial Assessment  Patient Complaints Anxiety;Depression;Worrying  Eye Contact Fair  Facial Expression Anxious;Sad;Worried  Affect Anxious;Sad  Speech Soft  Interaction Minimal  Motor Activity Slow  Appearance/Hygiene Unremarkable  Behavior Characteristics Anxious  Mood Depressed;Anxious;Preoccupied  Thought Process  Coherency WDL  Content WDL  Delusions None reported or observed  Perception WDL  Hallucination None reported or observed  Judgment Impaired  Confusion None  Danger to Self  Current suicidal ideation? Denies  Description of Suicide Plan None  Self-Injurious Behavior No self-injurious ideation or behavior indicators observed or expressed   Agreement Not to Harm Self Yes  Description of Agreement Verbal  Danger to Others  Danger to Others None reported or observed

## 2024-04-19 NOTE — Progress Notes (Signed)
   04/19/24 1116  [facility] Triage Screening (Walk-ins at [facility] only)  How Did You Hear About Us ? Self  What Is the Reason for Your Visit/Call Today? Patient states that she is depressed and suicidal.  She recently found out that her boyfriend of 1.5 years was seeing someone else.  Patient states that he was playing her and using her.  Patient states that he lied to her.  Patient states that he is refusing to talk to her and has blocked her on social media and she states that she has made multiple attempts to try to reach him and even had her phone number changed to see if he would pick up.  Patient states that she has been very depressed and suicidal.  She states that she tried to connect all her belts together this morning in order to try to hang herself from the ceiling fan and she states that she thought about drinking anti-freeze. Patient states, I am really too scared to hurt myself.   Patient states that she is not sleeping due to having racing thoughts.  She states that she has not eaten in two days.  Patient states that she is prescribed Lexapro  by Bennet Gaskins, but states that he recently changed her medicine to (Anafloxin?)  Prozac.  Patient denies any use of alcohol or drugs.  Patient is emergent.  How Long Has This Been Causing You Problems? 1 wk - 1 month  Have You Recently Had Any Thoughts About Hurting Yourself? Yes  How long ago did you have thoughts about hurting yourself? had thoughts today to hang self or drink anti-freeze  Are You Planning to Commit Suicide/Harm Yourself At This time? No  Have you Recently Had Thoughts About Hurting Someone Sherral? No  Are You Planning To Harm Someone At This Time? No  Physical Abuse Denies  Verbal Abuse Denies  Sexual Abuse Denies  Exploitation of patient/patient's resources Denies  Self-Neglect Denies  Possible abuse reported to: Other (Comment) (not necessary)  Are you currently experiencing any auditory, visual or other hallucinations? No  Have You  Used Any Alcohol or Drugs in the Past 24 Hours? No  Do you have any current medical co-morbidities that require immediate attention? No  Clinician description of patient physical appearance/behavior: clean and neat and casually dressed  What Do You Feel Would Help You the Most Today? Treatment for Depression or other mood problem  If access to [facility]gent [facility] was not available, would you have sought care in the Emergency Department? Yes  Determination of Need Emergent (2 hours)  Options For Referral BH Urgent Care;Inpatient Hospitalization  Determination of Need filed? Yes

## 2024-04-19 NOTE — ED Notes (Signed)
 Patient Vol transferred to [facility] via safe transport. Pt A&Ox4, ambulatory w/ steady gait and VSS upon departure. All belongings and paperwork given to safe transport.

## 2024-04-20 ENCOUNTER — Encounter (HOSPITAL_COMMUNITY): Payer: Self-pay

## 2024-04-20 DIAGNOSIS — F332 Major depressive disorder, recurrent severe without psychotic features: Secondary | ICD-10-CM | POA: Diagnosis not present

## 2024-04-20 NOTE — Progress Notes (Signed)
   04/20/24 2210  Psych Admission Type (Psych Patients Only)  Admission Status Voluntary/72 hour document signed  Psychosocial Assessment  Patient Complaints Anxiety  Eye Contact Fair  Facial Expression Animated  Affect Appropriate to circumstance  Speech Logical/coherent  Interaction Assertive  Motor Activity Other (Comment) (WDL)  Appearance/Hygiene Unremarkable  Behavior Characteristics Appropriate to situation  Mood Pleasant  Thought Process  Coherency WDL  Content WDL  Delusions None reported or observed  Perception WDL  Hallucination None reported or observed  Judgment Impaired  Confusion None  Danger to Self  Current suicidal ideation? Denies  Self-Injurious Behavior No self-injurious ideation or behavior indicators observed or expressed   Agreement Not to Harm Self Yes  Description of Agreement verbal  Danger to Others  Danger to Others None reported or observed

## 2024-04-20 NOTE — H&P (Signed)
 Psychiatric Admission Assessment Adult  Patient Identification: Nicole Huff MRN:  [id number] Date of Evaluation:  04/20/2024 Chief Complaint:  MDD (major depressive disorder), recurrent episode, severe (HCC) [F33.2] Principal Diagnosis: MDD (major depressive disorder), recurrent episode, severe (HCC) Diagnosis:  Principal Problem:   MDD (major depressive disorder), recurrent episode, severe (HCC)  CC: Depression with suicidal ideation.  History of Present Illness: Nicole Huff is a 33 year old African-American female with prior psychiatric diagnoses significant for MDD, recurrent episode severe, adjustment reaction with anxiety and depression, and PMHx significant  for epigastric pain.  Patient presented voluntarily to [facility] from [facility] behavioral health urgent care for complaint of worsening depression resulting in suicidal ideation in the context of recent break-up with the boyfriend of 1.5 years. After medical evaluation / stabilization & clearance, she was transferred to the [facility] for further psychiatric evaluation & treatments.  During this evaluation Nicole Huff reports she has history of depression and anxiety since she was a teenager.  Reports being on trial of Lexapro , hydroxyzine , and recently Effexor .  Reports her current stressor this time is breaking up 3 months ago with her boyfriend of 1.5 years, and recently he had another girlfriend and cut her off from all forms of communication with her. She states that she found out through phone records since he is on her phone plan.  She states that when she confronted him about it, he became very irate, making statements such as: I was using you, I needed money and was lying to you throughout our relationship.    Patient reports that she has racing thoughts and feeling very down and sad this past week.  She states that last night she tied up some belts together with a plan to hang herself from the  ceiling fan but reports being too scared to try it.  She also reports that she has not eaten any food in 2 days with plans of wishing that she would just go to sleep and never to wake up.  She endorses having a prior suicide attempt 2 years ago by overdosing but states I did not take enough pills to do the job.  She reports symptoms of depression to include hopelessness, sadness, worthlessness, loneliness, poor appetite and poor sleep.  Reports symptoms of anxiety to include chest tightness, shortness of breath, crying, shaking, labile mood, psychomotor agitation.  She denies symptoms of psychosis, mania, OCD, or PTSD.  Further denies history of abuse or trauma.  Patient reports receiving therapy at the [facility]. Patient currently lives at home with mom, stepdad, stepsister/child and grandmother.  She currently works full-time as a Chief Executive Officer and states that she attempted to go to work yesterday but was unable to keep her emotions together.  She denies substance use.   Objective: Patient presents alert, cooperative, calm, sad, and oriented to person, time, place, and situation.  Speech clear, coherent with soft volume and pattern.  Able to participate in answering assessment questions adequately. Thought process and thought content coherent, logical, and organized.  Objectively not responding to internal stimuli.  No delusional preoccupation or paranoia observed during this assessment.  She denies HI, or AVH.  Endorses passive suicidal ideation, however, able to contract for safety while in the hospital. Vital signs reviewed without critical values labs and EKG reviewed as indicated in the treatment plan.  Patient is admitted for mood stabilization, medication management, and safety.  Mode of transport to Hospital: Safe transport Current Outpatient (Home) Medication List: See  home medication listing PRN medication prior to evaluation: See home medication listing  ED course: Labs and  EKG we have been no analyzed Collateral Information: None obtained at this time POA/Legal Guardian: Patient is her on legal guardian  -why the patient is admitted in detail- Must include: location , quality , severity, duration, timing, context , modifying factors, and associated signs and symptoms  -Psych review of symptoms not addressed in HPI, including assessment of symptoms of Depression, Bipolar, Anxiety, panic attacks, psychosis, PTSD, OCD  Past Psychiatric Hx: Previous Psych Diagnoses: Depression, anxiety, and suicidal attempts by overdosing with medication Prior inpatient treatment: Denies Current/prior outpatient treatment: Seeing a therapist at the [facility] health care Prior rehab hx: Denies Psychotherapy hx: Yes History of suicide: X 1, 2 years ago by overdosing on medications History of homicide or aggression: Denies Psychiatric medication history: Has been on trial of Effexor  and Lexapro  Psychiatric medication compliance history: Noncompliance Neuromodulation history: Denies Current Psychiatrist: Denies Current therapist: Seeing a therapist at the [facility] health care  Substance Abuse Hx: Alcohol: Denies Tobacco: Denies Illicit drugs: Denies Rx drug abuse: Denies Rehab hx: The denies  Past Medical History: Medical Diagnoses: History of upper GI pain Home Rx: Denies Prior Hosp: Denies Prior Surgeries/Trauma: Denies Head trauma, LOC, concussions, seizures: Denies history of seizures Allergies: No known drug allergies LMP: June 2025 Contraception: Denies PCP: Denies  Family History: Medical: Patient unsure Psych: Patient unsure Psych Rx: Patient unsure SA/HA: Patient unsure Substance use family hx: Patient unsure  Social History: Childhood (bring, raised, lives now, parents, siblings, schooling, education): Reports some college Abuse: Denies history of abuse Marital Status: Single Sexual orientation: Female from birth Children: No children Employment:  Employed with the [employer] Peer Group: Denies peer group Housing: Lives at home with mom, stepdad, Engineer, manufacturing and child, and grandmother Finances: No financial difficulty Legal: Denies Special educational needs teacher: Denies affiliation with the [employer]  Associated Signs/Symptoms: Depression Symptoms:  depressed mood, anhedonia, fatigue, feelings of worthlessness/guilt, difficulty concentrating, hopelessness, suicidal thoughts with specific plan, anxiety, loss of energy/fatigue, disturbed sleep, (Hypo) Manic Symptoms:  Impulsivity, Anxiety Symptoms:  Excessive Worry, Psychotic Symptoms:  Not applicable PTSD Symptoms: NA  Total Time spent with patient: 1.5 hours  Is the patient at risk to self? Yes.    Has the patient been a risk to self in the past 6 months? Yes.    Has the patient been a risk to self within the distant past? Yes.    Is the patient a risk to others? No.  Has the patient been a risk to others in the past 6 months? No.  Has the patient been a risk to others within the distant past? No.   [place] Scale:  Flowsheet Row [facility] (Current[facility] Most recent reading at 04/19/2024  5:42 PM ED from 04/19/2024 in [facility] Most recent reading at 04/19/2024 12:56 PM UC from 11/02/2023 in [facility]  Most recent reading at 11/02/2023  7:53 PM  C-SSRS RISK CATEGORY High Risk High Risk No Risk   Alcohol Screening: 1. How often do you have a drink containing alcohol?: Never 2. How many drinks containing alcohol do you have on a typical day when you are drinking?: 1 or 2 3. How often do you have six or more drinks on one occasion?: Never AUDIT-C Score: 0 4. How often during the last year have you found that you were not able to  stop drinking once you had started?: Never 5. How often during the last year have you failed to do what was normally expected from you  because of drinking?: Never 6. How often during the last year have you needed a first drink in the morning to get yourself going after a heavy drinking session?: Never 7. How often during the last year have you had a feeling of guilt of remorse after drinking?: Never 8. How often during the last year have you been unable to remember what happened the night before because you had been drinking?: Never 9. Have you or someone else been injured as a result of your drinking?: No 10. Has a relative or friend or a doctor or another health worker been concerned about your drinking or suggested you cut down?: No Alcohol Use Disorder Identification Test Final Score (AUDIT): 0 Alcohol Brief Interventions/Follow-up: Alcohol education/Brief advice  Substance Abuse History in the last 12 months:  No. Consequences of Substance Abuse: NA Previous Psychotropic Medications: Yes  Psychological Evaluations: Yes  Past Medical History:  Past Medical History:  Diagnosis Date   Medical history non-contributory    Nausea vomiting and diarrhea 02/13/2021    Past Surgical History:  Procedure Laterality Date   NO PAST SURGERIES     Family History:  Family History  Problem Relation Age of Onset   Thyroid disease Mother    Thyroid disease Maternal Grandmother    Diabetes Maternal Grandfather    Tobacco Screening:  Social History   Tobacco Use  Smoking Status Never  Smokeless Tobacco Never    BH Tobacco Counseling     Are you interested in Tobacco Cessation Medications?  N/A, patient does not use tobacco products Counseled patient on smoking cessation:  N/A, patient does not use tobacco products Reason Tobacco Screening Not Completed: No value filed.    Social History:  Social History   Substance and Sexual Activity  Alcohol Use Not Currently   Comment: denied all alcohol use     Social History   Substance and Sexual Activity  Drug Use No    Additional Social History: Marital status:  Single Are you sexually active?: Yes What is your sexual orientation?: The patient stated straight. Does patient have children?: No     Allergies:  No Known Allergies Lab Results:  Results for orders placed or performed during the hospital encounter of 04/19/24 (from the past 48 hours)  CBC with Differential/Platelet     Status: None   Collection Time: 04/19/24 12:39 PM  Result Value Ref Range   WBC 5.7 4.0 - 10.5 K/uL   RBC 4.66 3.87 - 5.11 MIL/uL   Hemoglobin 13.1 12.0 - 15.0 g/dL   HCT 60.8 63.9 - 53.9 %   MCV 83.9 80.0 - 100.0 fL   MCH 28.1 26.0 - 34.0 pg   MCHC 33.5 30.0 - 36.0 g/dL   RDW 86.1 88.4 - 84.4 %   Platelets 183 150 - 400 K/uL    Comment: REPEATED TO VERIFY   nRBC 0.0 0.0 - 0.2 %   Neutrophils Relative % 75 %   Neutro Abs 4.3 1.7 - 7.7 K/uL   Lymphocytes Relative 16 %   Lymphs Abs 0.9 0.7 - 4.0 K/uL   Monocytes Relative 6 %   Monocytes Absolute 0.3 0.1 - 1.0 K/uL   Eosinophils Relative 2 %   Eosinophils Absolute 0.1 0.0 - 0.5 K/uL   Basophils Relative 1 %   Basophils Absolute 0.0 0.0 - 0.1 K/uL  Immature Granulocytes 0 %   Abs Immature Granulocytes 0.02 0.00 - 0.07 K/uL    Comment: Performed at [facility], [address], [place], [place] [address]  Comprehensive metabolic panel     Status: Abnormal   Collection Time: 04/19/24 12:39 PM  Result Value Ref Range   Sodium 138 135 - 145 mmol/L   Potassium 4.0 3.5 - 5.1 mmol/L   Chloride 103 98 - 111 mmol/L   CO2 22 22 - 32 mmol/L   Glucose, Bld 63 (L) 70 - 99 mg/dL    Comment: Glucose reference range applies only to samples taken after fasting for at least 8 hours.   BUN 10 6 - 20 mg/dL   Creatinine, Ser 9.22 0.44 - 1.00 mg/dL   Calcium 9.4 8.9 - 89.6 mg/dL   Total Protein 7.2 6.5 - 8.1 g/dL   Albumin 4.4 3.5 - 5.0 g/dL   AST 16 15 - 41 U/L   ALT 12 0 - 44 U/L   Alkaline Phosphatase 44 38 - 126 U/L   Total Bilirubin 1.4 (H) 0.0 - 1.2 mg/dL   GFR, Estimated >39 >39 mL/min    Comment:  (NOTE) Calculated using the CKD-EPI Creatinine Equation (2021)    Anion gap 13 5 - 15    Comment: Performed at [facility], [address] [place], [place] [address]  Hemoglobin A1c     Status: Abnormal   Collection Time: 04/19/24 12:39 PM  Result Value Ref Range   Hgb A1c MFr Bld 4.6 (L) 4.8 - 5.6 %    Comment: (NOTE) Diagnosis of Diabetes The following HbA1c ranges recommended by the American Diabetes Association (ADA) may be used as an aid in the diagnosis of diabetes mellitus.  Hemoglobin             Suggested A1C NGSP%              Diagnosis  <5.7                   Non Diabetic  5.7-6.4                Pre-Diabetic  >6.4                   Diabetic  <7.0                   Glycemic control for                       adults with diabetes.     Mean Plasma Glucose 85.32 mg/dL    Comment: Performed at [facility], [address], [place], [place] [address]  Ethanol     Status: None   Collection Time: 04/19/24 12:39 PM  Result Value Ref Range   Alcohol, Ethyl (B) <15 <15 mg/dL    Comment: (NOTE) For medical purposes only. Performed at [facility], [address] [place], [place] [address]   Lipid panel     Status: Abnormal   Collection Time: 04/19/24 12:39 PM  Result Value Ref Range   Cholesterol 191 0 - 200 mg/dL   Triglycerides 50 <849 mg/dL   HDL 45 >59 mg/dL   Total CHOL/HDL Ratio 4.2 RATIO   VLDL 10 0 - 40 mg/dL   LDL Cholesterol 863 (H) 0 - 99 mg/dL    Comment:        Total Cholesterol/HDL:CHD Risk Coronary Heart  Disease Risk Table                     Men   Women  1/2 Average Risk   3.4   3.3  Average Risk       5.0   4.4  2 X Average Risk   9.6   7.1  3 X Average Risk  23.4   11.0        Use the calculated Patient Ratio above and the CHD Risk Table to determine the patient's CHD Risk.        ATP III CLASSIFICATION (LDL):  <100     mg/dL   Optimal  899-870  mg/dL   Near or Above                    Optimal  130-159  mg/dL    Borderline  839-810  mg/dL   High  >809     mg/dL   Very High Performed at [facility], [address] [place], [place] [address]   TSH     Status: None   Collection Time: 04/19/24 12:39 PM  Result Value Ref Range   TSH 0.939 0.350 - 4.500 uIU/mL    Comment: Performed by a 3rd Generation assay with a functional sensitivity of <=0.01 uIU/mL. Performed at [facility], [address] [place], [place] [address]   POC urine preg, ED     Status: None   Collection Time: 04/19/24 12:39 PM  Result Value Ref Range   Preg Test, Ur Negative Negative  POCT Urine Drug Screen - (I-Screen)     Status: Normal   Collection Time: 04/19/24 12:39 PM  Result Value Ref Range   POC Amphetamine UR None Detected NONE DETECTED (Cut Off Level 1000 ng/mL)   POC Secobarbital (BAR) None Detected NONE DETECTED (Cut Off Level 300 ng/mL)   POC Buprenorphine (BUP) None Detected NONE DETECTED (Cut Off Level 10 ng/mL)   POC Oxazepam (BZO) None Detected NONE DETECTED (Cut Off Level 300 ng/mL)   POC Cocaine UR None Detected NONE DETECTED (Cut Off Level 300 ng/mL)   POC Methamphetamine UR None Detected NONE DETECTED (Cut Off Level 1000 ng/mL)   POC Morphine  None Detected NONE DETECTED (Cut Off Level 300 ng/mL)   POC Methadone UR None Detected NONE DETECTED (Cut Off Level 300 ng/mL)   POC Oxycodone UR None Detected NONE DETECTED (Cut Off Level 100 ng/mL)   POC Marijuana UR None Detected NONE DETECTED (Cut Off Level 50 ng/mL)   Blood Alcohol level:  Lab Results  Component Value Date   [facility] <15 04/19/2024   Metabolic Disorder Labs:  Lab Results  Component Value Date   HGBA1C 4.6 (L) 04/19/2024   MPG 85.32 04/19/2024   No results found for: PROLACTIN Lab Results  Component Value Date   CHOL 191 04/19/2024   TRIG 50 04/19/2024   HDL 45 04/19/2024   CHOLHDL 4.2 04/19/2024   VLDL 10 04/19/2024   LDLCALC 136 (H) 04/19/2024   LDLCALC 130 (H) 06/20/2020   Current Medications: Current  Facility-Administered Medications  Medication Dose Route Frequency Provider Last Rate Last Admin   acetaminophen  (TYLENOL ) tablet 650 mg  650 mg Oral Q6H PRN Brent, Amanda C, NP       alum & mag hydroxide-simeth (MAALOX/MYLANTA) 200-200-20 MG/5ML suspension 30 mL  30 mL Oral Q4H PRN Brent, Amanda C, NP       haloperidol  (HALDOL ) tablet 5 mg  5 mg Oral TID PRN Brent, Amanda C, NP       And   diphenhydrAMINE  (BENADRYL ) capsule 50 mg  50 mg Oral TID PRN Brent, Amanda C, NP       haloperidol  lactate (HALDOL ) injection 5 mg  5 mg Intramuscular TID PRN Brent, Amanda C, NP       And   diphenhydrAMINE  (BENADRYL ) injection 50 mg  50 mg Intramuscular TID PRN Brent, Amanda C, NP       And   LORazepam  (ATIVAN ) injection 2 mg  2 mg Intramuscular TID PRN Brent, Amanda C, NP       haloperidol  lactate (HALDOL ) injection 10 mg  10 mg Intramuscular TID PRN Thresa Alan BROCKS, NP       And   diphenhydrAMINE  (BENADRYL ) injection 50 mg  50 mg Intramuscular TID PRN Brent, Amanda C, NP       And   LORazepam  (ATIVAN ) injection 2 mg  2 mg Intramuscular TID PRN Brent, Amanda C, NP       hydrOXYzine  (ATARAX ) tablet 25 mg  25 mg Oral TID PRN Brent, Amanda C, NP   25 mg at 04/19/24 2037   magnesium  hydroxide (MILK OF MAGNESIA) suspension 30 mL  30 mL Oral Daily PRN Brent, Amanda C, NP       traZODone  (DESYREL ) tablet 50 mg  50 mg Oral QHS PRN Brent, Amanda C, NP   50 mg at 04/19/24 2037   venlafaxine  XR (EFFEXOR -XR) 24 hr capsule 37.5 mg  37.5 mg Oral Q supper Brent, Amanda C, NP   37.5 mg at 04/19/24 2037   PTA Medications: Medications Prior to Admission  Medication Sig Dispense Refill Last Dose/Taking   diphenhydrAMINE  (BENADRYL ) 25 mg capsule Take 25 mg by mouth at bedtime.      venlafaxine  XR (EFFEXOR -XR) 37.5 MG 24 hr capsule Take 1 capsule (37.5 mg total) by mouth daily with breakfast. for anxiety and depression. (Patient taking differently: Take 37.5 mg by mouth at bedtime. for anxiety and depression.) 90 capsule 0     AIMS:  ,  ,  ,  ,  ,  ,    Musculoskeletal: Strength & Muscle Tone: within normal limits Gait & Station: normal Patient leans: N/A  Psychiatric Specialty Exam:  Presentation  General Appearance:  Casual; Appropriate for Environment; Fairly Groomed  Eye Contact: Good  Speech: Clear and Coherent  Speech Volume: Normal  Handedness: Right  Mood and Affect  Mood: Anxious; Depressed; Worthless; Hopeless  Affect: Congruent  Thought Process  Thought Processes: Coherent  Duration of Psychotic Symptoms:N/A Past Diagnosis of Schizophrenia or Psychoactive disorder: No  Descriptions of Associations:Intact  Orientation:Full (Time, Place and Person)  Thought Content:WDL  Hallucinations:Hallucinations: None  Ideas of Reference:None  Suicidal Thoughts:Suicidal Thoughts: Yes, Passive SI Active Intent and/or Plan: Without Intent; Without Plan; Without Means to Carry Out; Without Access to Means SI Passive Intent and/or Plan: Without Intent; Without Plan; Without Access to Means; Without Means to Carry Out  Homicidal Thoughts:Homicidal Thoughts: No  Sensorium  Memory: Immediate Fair; Recent Fair  Judgment: Fair  Insight: Fair  Chartered certified accountant: Fair  Attention Span: Fair  Recall: [employer] of Knowledge: Fair  Language: Fair  Psychomotor Activity  Psychomotor Activity: Psychomotor Activity: Normal  Assets  Assets: Communication Skills; Physical Health; Resilience  Sleep  Sleep: Sleep: Good  Estimated Sleeping Duration (Last 24 Hours): 6.75-7.00 hours  Physical Exam: Physical Exam Vitals and nursing note reviewed.  Constitutional:  General: She is not in acute distress.    Appearance: She is normal weight. She is not ill-appearing.  HENT:     Head: Normocephalic.     Right Ear: External ear normal.     Left Ear: External ear normal.     Nose: Nose normal.     Mouth/Throat:     Mouth: Mucous membranes are  moist.     Pharynx: Oropharynx is clear.   Eyes:     Extraocular Movements: Extraocular movements intact.    Cardiovascular:     Rate and Rhythm: Normal rate.     Pulses: Normal pulses.  Pulmonary:     Effort: Pulmonary effort is normal.  Abdominal:     Comments: Deferred  Genitourinary:    Comments: Deferred   Musculoskeletal:        General: Normal range of motion.     Cervical back: Normal range of motion.   Skin:    General: Skin is warm.   Neurological:     General: No focal deficit present.     Mental Status: She is alert and oriented to person, place, and time.   Psychiatric:        Mood and Affect: Mood normal.        Behavior: Behavior normal.        Thought Content: Thought content normal.    Review of Systems  Constitutional:  Negative for chills and fever.  HENT:  Negative for sore throat.   Eyes:  Negative for blurred vision.  Respiratory:  Negative for cough, sputum production, shortness of breath and wheezing.   Cardiovascular:  Negative for chest pain and palpitations.  Gastrointestinal:  Negative for abdominal pain, constipation, diarrhea, heartburn, nausea and vomiting.  Genitourinary:  Negative for dysuria.  Musculoskeletal:  Negative for falls.  Skin:  Negative for itching and rash.  Neurological:  Negative for dizziness, seizures, loss of consciousness and headaches.  Endo/Heme/Allergies:        See allergy listing  Psychiatric/Behavioral:  Positive for depression and suicidal ideas. Negative for hallucinations and substance abuse. The patient is nervous/anxious. The patient does not have insomnia.    Blood pressure 102/74, pulse 81, temperature 98.2 F (36.8 C), temperature source Oral, resp. rate 14, height 5' 7 (1.702 m), weight 51.6 kg, last menstrual period 03/28/2024, SpO2 100%. Body mass index is 17.82 kg/m.  Treatment Plan Summary: Daily contact with patient to assess and evaluate symptoms and progress in treatment and Medication  management  Physician Treatment Plan for Primary Diagnosis:  Assessment:  Nicole Huff is a 33 year old African-American female with prior psychiatric diagnoses significant for MDD, recurrent episode severe, adjustment reaction with anxiety and depression, and PMHx significant  for epigastric pain.  Patient [facility] to [facility] behavioral health urgent care for complaint of worsening depression resulting in suicidal ideation in the context of recent break-up with the boyfriend of 1.5 years.   MDD (major depressive disorder), recurrent episode, severe (HCC)  Plans: Medications: --Continue Effexor  XR 24 hr capsule 37.5 mg p.o. daily with supper for depression and anxiety -- Continue hydroxyzine  tablet 25 mg p.o. 3 times daily as needed for anxiety -- Continue Trazodone  Tablets 50 mg p.o. q. nightly as needed for insomnia  Other PRN Medications  -Acetaminophen  650 mg every 6 as needed/mild pain  -Maalox 30 mL oral every 4 as needed/digestion  -Magnesium  hydroxide 30 mL daily as needed/mild constipation   --The risks/benefits/side-effects/alternatives to this medication  were discussed in detail with the patient and time was given for questions. The patient consents to medication trial.   -- Metabolic profile and EKG monitoring obtained while on an atypical antipsychotic (BMI: Lipid Panel: HbgA1c: QTc:)   -- Encouraged patient to participate in unit milieu and in scheduled group therapies   Continue BH Agitation Protocol  --Haldol  5 mg, oral, 3 times daily as needed, mild agitation  --Benadryl  50 mg, oral, 3 times daily as needed, mild agitation                                      OR   --Haldol  injection 5 mg, IM, 3 times daily as needed, moderate agitation  --Benadryl  injection 50 mg, IM, 3 times daily as needed, moderate agitation  --Ativan  injection 2 mg, IM, 3 times daily as needed, moderate agitation                                       OR  --Haldol  injection 10 mg, IM, 3 times daily as needed, severe agitation  --Benadryl  injection 50 mg, IM, 3 times daily as needed, severe agitation  --Ativan  injection 2 mg, IM, 3 times daily as needed, severe agitation   Admission labs reviewed: CMP: Glucose 63, total bilirubin 1.4, otherwise normal.  Lipid panel: LDL 136, otherwise normal.  CBC with differential: All within normal limits.  Hemoglobin A1c 4.6.  TSH: 0.939 within normal limits.  UDS: No substances indicated.  New labs ordered: None  EKG reviewed: Normal sinus rhythm, ventricular rate 66, QT/QTc 396/415  Safety and Monitoring:  Voluntary admission to inpatient psychiatric unit for safety, stabilization and treatment  Daily contact with patient to assess and evaluate symptoms and progress in treatment  Patient's case to be discussed in multi-disciplinary team meeting  Observation Level : q15 minute checks  Vital signs: q12 hours  Precautions: suicide, but pt currently verbally contracts for safety on unit?   Discharge Planning:  Social work and case management to assist with discharge planning and identification of hospital follow-up needs prior to discharge  Estimated LOS: 5-7?days  Discharge Concerns: Need to establish a safety plan; Medication compliance and effectiveness  Discharge Goals: Return home with outpatient referrals for mental health follow-up including medication management/psychotherapy.   Long Term Goal(s): Improvement in symptoms so as ready for discharge  Short Term Goals: Ability to identify changes in lifestyle to reduce recurrence of condition will improve, Ability to verbalize feelings will improve, Ability to disclose and discuss suicidal ideas, Ability to demonstrate self-control will improve, Ability to identify and develop effective coping behaviors will improve, Ability to maintain clinical measurements within normal limits will improve, Compliance with prescribed medications  will improve, and Ability to identify triggers associated with substance abuse/mental health issues will improve  Physician Treatment Plan for Secondary Diagnosis: Principal Problem:   MDD (major depressive disorder), recurrent episode, severe (HCC)  I certify that inpatient services furnished can reasonably be expected to improve the patient's condition.    Rosangela Fehrenbach C Nona Gracey, FNP 6/27/20251:20 PM

## 2024-04-20 NOTE — BHH Counselor (Signed)
 Adult Comprehensive Assessment  Patient ID: Nicole Huff, female   DOB: [birth year], 33 y.o.   MRN: [id number]  Information Source: Information source: Patient  Current Stressors:  Patient states their primary concerns and needs for treatment are:: The patient stated that she has been feeling anxious and depressed. The patient stated that she has been having SI with a plan. Patient states their goals for this hospitilization and ongoing recovery are:: The patient stated that she would like to learn coping skills when dealing with negative situations. Educational / Learning stressors: None reported. Employment / Job issues: None reported. Family Relationships: None reported. Financial / Lack of resources (include bankruptcy): The patient stated that the car she bought was reposed and her and boyfriend was evicted from their home. She stated that she has maxed out a couple of credit cards. Housing / Lack of housing: The patient stated that she was evicted from her home and is currently staying with her mom. Physical health (include injuries & life threatening diseases): None reported. Social relationships: The patient stated stress with ex boyfriend. Substance abuse: The patient stated none. Bereavement / Loss: None reported.  Living/Environment/Situation:  Living Arrangements: Parent, Other relatives Living conditions (as described by patient or guardian): The patient stated fine. Who else lives in the home?: The patient stated her mom and her husband, grandma, stepsister and her son. How long has patient lived in current situation?: The patient stated since Feb. What is atmosphere in current home: Comfortable  Family History:  Marital status: Single Are you sexually active?: Yes What is your sexual orientation?: The patient stated straight. Does patient have children?: No  Childhood History:  By whom was/is the patient raised?: Both parents Description of patient's relationship  with caregiver when they were a child: The patient stated that the relationship was good. Patient's description of current relationship with people who raised him/her: The patient stated that the current relationship is good. How were you disciplined when you got in trouble as a child/adolescent?: The stated a few spankings and was told no. Does patient have siblings?: Yes Number of Siblings: 4 Description of patient's current relationship with siblings: The patient stated good. Did patient suffer any verbal/emotional/physical/sexual abuse as a child?: No Did patient suffer from severe childhood neglect?: No Has patient ever been sexually abused/assaulted/raped as an adolescent or adult?: No Was the patient ever a victim of a crime or a disaster?: No Witnessed domestic violence?: No Has patient been affected by domestic violence as an adult?: No  Education:  Highest grade of school patient has completed: The patient stated some college. Currently a student?: No Learning disability?: No  Employment/Work Situation:   Employment Situation: Employed Where is Patient Currently Employed?: The patient stated [employer]. How Long has Patient Been Employed?: The patient stated 2 years. Are You Satisfied With Your Job?: No Do You Work More Than One Job?: No Work Stressors: None reported Patient's Job has Been Impacted by Current Illness: Yes Describe how Patient's Job has Been Impacted: The patient stated that she is not able to go. What is the Longest Time Patient has Held a Job?: The stated 3 and half years. Where was the Patient Employed at that Time?: The patient [employer]. Has Patient ever Been in the [employer]?: No  Financial Resources:   Financial resources: Income from employment Does patient have a representative payee or guardian?: No  Alcohol/Substance Abuse:   What has been your use of drugs/alcohol within the last 12 months?: The  patient stated alcohol once in a  while. If attempted suicide, did drugs/alcohol play a role in this?: No Alcohol/Substance Abuse Treatment Hx: Denies past history Has alcohol/substance abuse ever caused legal problems?: No  Social Support System:   Patient's Community Support System: Good Describe Community Support System: The patient state dthat it is good. Type of faith/religion: The patient stated christian. How does patient's faith help to cope with current illness?: The patient stated prayer.  Leisure/Recreation:   Do You Have Hobbies?: No  Strengths/Needs:   What is the patient's perception of their strengths?: The patient she dont know. Patient states these barriers may affect/interfere with their treatment: The patient stated no. Patient states these barriers may affect their return to the community: The patient stated no. Other important information patient would like considered in planning for their treatment: The patient stated no.  Discharge Plan:   Currently receiving community mental health services: Yes (From Whom) (The patient stated therapy through [facility].) Patient states concerns and preferences for aftercare planning are: The stated that she would like to continue with [facility]. Patient states they will know when they are safe and ready for discharge when: The patient stated when she is feeling a little happpier. Does patient have access to transportation?: Yes Does patient have financial barriers related to discharge medications?: No Will patient be returning to same living situation after discharge?: Yes  Summary/Recommendations:     The patient is a 33 year old Caucasian female from [place] [facility]) who presented voluntarily and accompanied by her mother due to worsening depression and recent SI. During assessment the patient stated that she has been feeling anxious and depressed that has resulting with her having SI with a plan. The patient current stressors include  ex-boyfriend, car being reposed, and being evicted from hand and having to move in with her mother. The patient stated that she is currently employed at [employer]. The patient denies and financial or work stress. The patient stated that she has no insurance. The patient denies drug use but states that she will drink once in a while. The patient is currently with a mental health provided through [facility] and states she wants to continue services with them. The patient reported that she has an appointment Thursday on the 3rd of July at 3:00 pm. The patient denies any access to guns or weapons. Recommendations include: crisis stabilization, therapeutic milieu, encourage group attendance and participation, medication management for mood stabilization and development of comprehensive mental wellness.   Roselyn GORMAN Lento. 04/20/2024

## 2024-04-20 NOTE — Plan of Care (Signed)
  Problem: Activity: Goal: Sleeping patterns will improve Outcome: Progressing   Problem: Coping: Goal: Ability to verbalize frustrations and anger appropriately will improve Outcome: Progressing Goal: Ability to demonstrate self-control will improve Outcome: Progressing   Problem: Safety: Goal: Periods of time without injury will increase Outcome: Progressing   Problem: Physical Regulation: Goal: Ability to maintain clinical measurements within normal limits will improve Outcome: Progressing

## 2024-04-20 NOTE — BHH Suicide Risk Assessment (Signed)
 Suicide Risk Huff  Admission Huff    Nicole Huff   Nursing information obtained from:  Patient Demographic factors:  Low socioeconomic status, Caucasian Current Mental Status:  Suicidal ideation indicated by patient, Self-harm behaviors, Self-harm thoughts Loss Factors:  Loss of significant relationship, Financial problems / change in socioeconomic status Historical Factors:  Prior suicide attempts, Impulsivity Risk Reduction Factors:  Employed, Living with another person, especially a relative  Total Time spent with patient: 45 minutes Principal Problem: MDD (major depressive disorder), recurrent episode, severe (HCC) Diagnosis:  Principal Problem:   MDD (major depressive disorder), recurrent episode, severe (HCC)  Subjective Data: Nicole Huff is a 33 year old African-American female with prior psychiatric diagnoses significant for MDD, recurrent episode severe, adjustment reaction with anxiety and depression, and PMHx significant  for epigastric pain.  Patient presented voluntarily to [facility] from [facility] behavioral health urgent care for complaint of worsening depression resulting in suicidal ideation in the context of recent break-up with the boyfriend of 1.5 years.   Continued Clinical Symptoms:  Alcohol Use Disorder Identification Test Final Score (AUDIT): 0 The Alcohol Use Disorders Identification Test, Guidelines for Use in Primary Care, Second Edition.  World Science writer [facility]). Score between 0-7:  no or low risk or alcohol related problems. Score between 8-15:  moderate risk of alcohol related problems. Score between 16-19:  high risk of alcohol related problems. Score 20 or above:  warrants further diagnostic evaluation for alcohol dependence and treatment.  CLINICAL FACTORS:   Severe Anxiety and/or Agitation Depression:   Anhedonia Hopelessness Impulsivity Severe More than one  psychiatric diagnosis Unstable or Poor Therapeutic Relationship Previous Psychiatric Diagnoses and Treatments Medical Diagnoses and Treatments/Surgeries  Musculoskeletal: Strength & Muscle Tone: within normal limits Gait & Station: normal Patient leans: N/A  Psychiatric Specialty Exam:  Presentation  General Appearance:  Casual; Appropriate for Environment; Fairly Groomed  Eye Contact: Good  Speech: Clear and Coherent  Speech Volume: Normal  Handedness: Right  Mood and Affect  Mood: Anxious; Depressed; Worthless; Hopeless  Affect: Congruent  Thought Process  Thought Processes: Coherent  Descriptions of Associations:Intact  Orientation:Full (Time, Place and Person)  Thought Content:WDL  History of Schizophrenia/Schizoaffective disorder:No  Duration of Psychotic Symptoms:No data recorded Hallucinations:Hallucinations: None  Ideas of Reference:None  Suicidal Thoughts:Suicidal Thoughts: Yes, Passive SI Active Intent and/or Plan: Without Intent; Without Plan; Without Means to Carry Out; Without Access to Means SI Passive Intent and/or Plan: Without Intent; Without Plan; Without Access to Means; Without Means to Carry Out  Homicidal Thoughts:Homicidal Thoughts: No  Sensorium  Memory: Immediate Fair; Recent Fair  Judgment: Fair  Insight: Fair  Chartered certified accountant: Fair  Attention Span: Fair  Recall: [employer] of Knowledge: Fair  Language: Fair  Psychomotor Activity  Psychomotor Activity: Psychomotor Activity: Normal  Assets  Assets: Communication Skills; Physical Health; Resilience  Sleep  Sleep: Sleep: Good Number of Hours of Sleep: 9  Physical Exam: Physical Exam Vitals and nursing note reviewed.  Constitutional:      Appearance: She is normal weight.  HENT:     Head: Normocephalic.     Right Ear: External ear normal.     Left Ear: External ear normal.     Nose: Nose normal.     Mouth/Throat:      Mouth: Mucous membranes are moist.     Pharynx: Oropharynx is clear.   Eyes:     Extraocular Movements: Extraocular movements intact.    Cardiovascular:  Rate and Rhythm: Normal rate.     Pulses: Normal pulses.  Pulmonary:     Effort: Pulmonary effort is normal.  Abdominal:     Comments: Deferred   Genitourinary:    Comments: Deferred   Musculoskeletal:        General: Normal range of motion.     Cervical back: Normal range of motion.   Skin:    General: Skin is warm.   Neurological:     General: No focal deficit present.     Mental Status: She is alert and oriented to person, place, and time.   Psychiatric:        Mood and Affect: Mood normal.        Behavior: Behavior normal.        Thought Content: Thought content normal.    Review of Systems  Constitutional:  Negative for chills and fever.  HENT:  Negative for sore throat.   Eyes:  Negative for blurred vision.  Respiratory:  Negative for wheezing.   Cardiovascular:  Negative for chest pain and palpitations.  Gastrointestinal:  Negative for heartburn and nausea.  Genitourinary:  Negative for dysuria.  Musculoskeletal:  Negative for falls.  Skin:  Negative for itching and rash.  Neurological:  Negative for dizziness, seizures, loss of consciousness and headaches.  Endo/Heme/Allergies:        See allergy listing  Psychiatric/Behavioral:  Positive for depression and suicidal ideas. Negative for hallucinations and substance abuse. The patient is nervous/anxious. The patient does not have insomnia.    Blood pressure 102/74, pulse 81, temperature 98.2 F (36.8 C), temperature source Oral, resp. rate 14, height 5' 7 (1.702 m), weight 51.6 kg, last menstrual period 03/28/2024, SpO2 100%. Body mass index is 17.82 kg/m.  COGNITIVE FEATURES THAT CONTRIBUTE TO RISK:  Polarized thinking    SUICIDE RISK:   Severe:  Frequent, intense, and enduring suicidal ideation, specific plan, no subjective intent, but some  objective markers of intent (i.e., choice of lethal method), the method is accessible, some limited preparatory behavior, evidence of impaired self-control, severe dysphoria/symptomatology, multiple risk factors present, and few if any protective factors, particularly a lack of social support.  PLAN OF CARE: Treatment Plan Summary: Daily contact with patient to assess and evaluate symptoms and progress in treatment and Medication management  Physician Treatment Plan for Primary Diagnosis:  Huff:  Nicole Huff is a 33 year old African-American female with prior psychiatric diagnoses significant for MDD, recurrent episode severe, adjustment reaction with anxiety and depression, and PMHx significant  for epigastric pain.  Patient presented voluntarily to [facility] from [facility] behavioral health urgent care for complaint of worsening depression resulting in suicidal ideation in the context of recent break-up with the boyfriend of 1.5 years.   MDD (major depressive disorder), recurrent episode, severe (HCC)  Plans: Medications: --Continue Effexor  XR 24 hr capsule 37.5 mg p.o. daily with supper for depression and anxiety -- Continue hydroxyzine  tablet 25 mg p.o. 3 times daily as needed for anxiety -- Continue Trazodone  Tablets 50 mg p.o. q. nightly as needed for insomnia  Other PRN Medications  -Acetaminophen  650 mg every 6 as needed/mild pain  -Maalox 30 mL oral every 4 as needed/digestion  -Magnesium  hydroxide 30 mL daily as needed/mild constipation   --The risks/benefits/side-effects/alternatives to this medication were discussed in detail with the patient and time was given for questions. The patient consents to medication trial.   -- Metabolic profile and EKG monitoring obtained while on an atypical  antipsychotic (BMI: Lipid Panel: HbgA1c: QTc:)   -- Encouraged patient to participate in unit milieu and in scheduled group therapies   Continue BH  Agitation Protocol  --Haldol  5 mg, oral, 3 times daily as needed, mild agitation  --Benadryl  50 mg, oral, 3 times daily as needed, mild agitation                                      OR   --Haldol  injection 5 mg, IM, 3 times daily as needed, moderate agitation  --Benadryl  injection 50 mg, IM, 3 times daily as needed, moderate agitation  --Ativan  injection 2 mg, IM, 3 times daily as needed, moderate agitation                                      OR  --Haldol  injection 10 mg, IM, 3 times daily as needed, severe agitation  --Benadryl  injection 50 mg, IM, 3 times daily as needed, severe agitation  --Ativan  injection 2 mg, IM, 3 times daily as needed, severe agitation   Admission labs reviewed: CMP: Glucose 63, total bilirubin 1.4, otherwise normal.  Lipid panel: LDL 136, otherwise normal.  CBC with differential: All within normal limits.  Hemoglobin A1c 4.6.  TSH: 0.939 within normal limits.  UDS: No substances indicated.  New labs ordered: None  EKG reviewed: Normal sinus rhythm, ventricular rate 66, QT/QTc 396/415  Safety and Monitoring:  Voluntary admission to inpatient psychiatric unit for safety, stabilization and treatment  Daily contact with patient to assess and evaluate symptoms and progress in treatment  Patient's case to be discussed in multi-disciplinary team meeting  Observation Level : q15 minute checks  Vital signs: q12 hours  Precautions: suicide, but pt currently verbally contracts for safety on unit?   Discharge Planning:  Social work and case management to assist with discharge planning and identification of hospital follow-up needs prior to discharge  Estimated LOS: 5-7?days  Discharge Concerns: Need to establish a safety plan; Medication compliance and effectiveness  Discharge Goals: Return home with outpatient referrals for mental health follow-up including medication management/psychotherapy.   Long Term Goal(s): Improvement in symptoms so as ready for  discharge  Short Term Goals: Ability to identify changes in lifestyle to reduce recurrence of condition will improve, Ability to verbalize feelings will improve, Ability to disclose and discuss suicidal ideas, Ability to demonstrate self-control will improve, Ability to identify and develop effective coping behaviors will improve, Ability to maintain clinical measurements within normal limits will improve, Compliance with prescribed medications will improve, and Ability to identify triggers associated with substance abuse/mental health issues will improve  Physician Treatment Plan for Secondary Diagnosis: Principal Problem:   MDD (major depressive disorder), recurrent episode, severe (HCC)  I certify that inpatient services furnished can reasonably be expected to improve the patient's condition.   Ellouise JAYSON Azure, FNP 04/20/2024, 12:32 PM

## 2024-04-20 NOTE — Group Note (Signed)
 Recreation Therapy Group Note   Group Topic:Team Building  Group Date: 04/20/2024 Start Time: 0930 End Time: 1000 Facilitators: Otillia Cordone-McCall, LRT,CTRS Location: [address] Dayroom   Group Topic: Communication, Team Building, Problem Solving  Goal Area(s) Addresses:  Patient will effectively work with peer towards shared goal.  Patient will identify skills used to make activity successful.  Patient will identify how skills used during activity can be used to reach post d/c goals.   Behavioral Response:   Intervention: STEM Activity  Activity: Straw Bridge. In teams of 3-5, patients were given 15 plastic drinking straws and an equal length of masking tape. Using the materials provided, patients were instructed to build a free standing bridge-like structure to suspend an everyday item (ex: puzzle box) off of the floor or table surface. All materials were required to be used by the team in their design. LRT facilitated post-activity discussion reviewing team process. Patients were encouraged to reflect how the skills used in this activity can be generalized to daily life post discharge.   Education: Pharmacist, community, Scientist, physiological, Discharge Planning   Education Outcome: Acknowledges education/In group clarification offered/Needs additional education.    Affect/Mood: N/A   Participation Level: Did not attend    Clinical Observations/Individualized Feedback:     Plan: Continue to engage patient in RT group sessions 2-3x/week.   Clarke Amburn-McCall, LRT,CTRS 04/20/2024 1:14 PM

## 2024-04-20 NOTE — Progress Notes (Signed)
 Patient rated her anxiety level 6/10 and her depression level 8/10 with 10 being the highest and 0 none. Patient identified her goal for today as, Developing coping skills for negative problems. Medication compliant, minimal interaction observed with peers. Appetite good on shift. Safety maintained.  04/20/24 0920  Psych Admission Type (Psych Patients Only)  Admission Status Voluntary  Psychosocial Assessment  Patient Complaints Anxiety;Depression  Eye Contact Fair  Facial Expression Anxious;Sad;Worried  Affect Anxious;Sad  Speech Soft  Interaction Minimal  Motor Activity Slow  Appearance/Hygiene Unremarkable  Behavior Characteristics Cooperative;Anxious  Mood Depressed;Anxious  Thought Process  Coherency WDL  Content WDL  Delusions None reported or observed  Perception WDL  Hallucination None reported or observed  Judgment Impaired  Confusion None  Danger to Self  Current suicidal ideation? Denies  Self-Injurious Behavior No self-injurious ideation or behavior indicators observed or expressed   Agreement Not to Harm Self Yes  Description of Agreement Verbal  Danger to Others  Danger to Others None reported or observed

## 2024-04-20 NOTE — BH IP Treatment Plan (Signed)
 Interdisciplinary Treatment and Diagnostic Plan Update  04/20/2024 Time of Session: 10:10 AM Nicole Huff MRN: [id number]  Principal Diagnosis: MDD (major depressive disorder), recurrent episode, severe (HCC)  Secondary Diagnoses: Principal Problem:   MDD (major depressive disorder), recurrent episode, severe (HCC)   Current Medications:  Current Facility-Administered Medications  Medication Dose Route Frequency Provider Last Rate Last Admin   acetaminophen  (TYLENOL ) tablet 650 mg  650 mg Oral Q6H PRN Brent, Amanda C, NP       alum & mag hydroxide-simeth (MAALOX/MYLANTA) 200-200-20 MG/5ML suspension 30 mL  30 mL Oral Q4H PRN Brent, Amanda C, NP       haloperidol  (HALDOL ) tablet 5 mg  5 mg Oral TID PRN Brent, Amanda C, NP       And   diphenhydrAMINE  (BENADRYL ) capsule 50 mg  50 mg Oral TID PRN Brent, Amanda C, NP       haloperidol  lactate (HALDOL ) injection 5 mg  5 mg Intramuscular TID PRN Brent, Amanda C, NP       And   diphenhydrAMINE  (BENADRYL ) injection 50 mg  50 mg Intramuscular TID PRN Brent, Amanda C, NP       And   LORazepam  (ATIVAN ) injection 2 mg  2 mg Intramuscular TID PRN Brent, Amanda C, NP       haloperidol  lactate (HALDOL ) injection 10 mg  10 mg Intramuscular TID PRN Thresa Alan BROCKS, NP       And   diphenhydrAMINE  (BENADRYL ) injection 50 mg  50 mg Intramuscular TID PRN Brent, Amanda C, NP       And   LORazepam  (ATIVAN ) injection 2 mg  2 mg Intramuscular TID PRN Brent, Amanda C, NP       hydrOXYzine  (ATARAX ) tablet 25 mg  25 mg Oral TID PRN Brent, Amanda C, NP   25 mg at 04/19/24 2037   magnesium  hydroxide (MILK OF MAGNESIA) suspension 30 mL  30 mL Oral Daily PRN Brent, Amanda C, NP       traZODone  (DESYREL ) tablet 50 mg  50 mg Oral QHS PRN Brent, Amanda C, NP   50 mg at 04/19/24 2037   venlafaxine  XR (EFFEXOR -XR) 24 hr capsule 37.5 mg  37.5 mg Oral Q supper Brent, Amanda C, NP   37.5 mg at 04/19/24 2037   PTA Medications: Medications Prior to Admission   Medication Sig Dispense Refill Last Dose/Taking   diphenhydrAMINE  (BENADRYL ) 25 mg capsule Take 25 mg by mouth at bedtime.      venlafaxine  XR (EFFEXOR -XR) 37.5 MG 24 hr capsule Take 1 capsule (37.5 mg total) by mouth daily with breakfast. for anxiety and depression. (Patient taking differently: Take 37.5 mg by mouth at bedtime. for anxiety and depression.) 90 capsule 0     Patient Stressors: Financial difficulties   Marital or family conflict   Traumatic event    Patient Strengths: Ability for insight  Average or above average intelligence  Capable of independent living  Forensic psychologist fund of knowledge  Motivation for treatment/growth  Physical Health  Supportive family/friends  Work skills   Treatment Modalities: Medication Management, Group therapy, Case management,  1 to 1 session with clinician, Psychoeducation, Recreational therapy.   Physician Treatment Plan for Primary Diagnosis: MDD (major depressive disorder), recurrent episode, severe (HCC) Long Term Goal(s): Improvement in symptoms so as ready for discharge   Short Term Goals: Ability to identify changes in lifestyle to reduce recurrence of condition will improve Ability to verbalize feelings will improve Ability to disclose  and discuss suicidal ideas Ability to demonstrate self-control will improve Ability to identify and develop effective coping behaviors will improve Ability to maintain clinical measurements within normal limits will improve Compliance with prescribed medications will improve Ability to identify triggers associated with substance abuse/mental health issues will improve  Medication Management: Evaluate patient's response, side effects, and tolerance of medication regimen.  Therapeutic Interventions: 1 to 1 sessions, Unit Group sessions and Medication administration.  Evaluation of Outcomes: Not Progressing  Physician Treatment Plan for Secondary Diagnosis: Principal Problem:    MDD (major depressive disorder), recurrent episode, severe (HCC)  Long Term Goal(s): Improvement in symptoms so as ready for discharge   Short Term Goals: Ability to identify changes in lifestyle to reduce recurrence of condition will improve Ability to verbalize feelings will improve Ability to disclose and discuss suicidal ideas Ability to demonstrate self-control will improve Ability to identify and develop effective coping behaviors will improve Ability to maintain clinical measurements within normal limits will improve Compliance with prescribed medications will improve Ability to identify triggers associated with substance abuse/mental health issues will improve     Medication Management: Evaluate patient's response, side effects, and tolerance of medication regimen.  Therapeutic Interventions: 1 to 1 sessions, Unit Group sessions and Medication administration.  Evaluation of Outcomes: Not Progressing   RN Treatment Plan for Primary Diagnosis: MDD (major depressive disorder), recurrent episode, severe (HCC) Long Term Goal(s): Knowledge of disease and therapeutic regimen to maintain health will improve  Short Term Goals: Ability to remain free from injury will improve, Ability to verbalize frustration and anger appropriately will improve, Ability to demonstrate self-control, Ability to participate in decision making will improve, Ability to verbalize feelings will improve, Ability to disclose and discuss suicidal ideas, Ability to identify and develop effective coping behaviors will improve, and Compliance with prescribed medications will improve  Medication Management: RN will administer medications as ordered by provider, will assess and evaluate patient's response and provide education to patient for prescribed medication. RN will report any adverse and/or side effects to prescribing provider.  Therapeutic Interventions: 1 on 1 counseling sessions, Psychoeducation, Medication  administration, Evaluate responses to treatment, Monitor vital signs and CBGs as ordered, Perform/monitor CIWA, COWS, AIMS and Fall Risk screenings as ordered, Perform wound care treatments as ordered.  Evaluation of Outcomes: Not Progressing   LCSW Treatment Plan for Primary Diagnosis: MDD (major depressive disorder), recurrent episode, severe (HCC) Long Term Goal(s): Safe transition to appropriate next level of care at discharge, Engage patient in therapeutic group addressing interpersonal concerns.  Short Term Goals: Engage patient in aftercare planning with referrals and resources, Increase social support, Increase ability to appropriately verbalize feelings, Increase emotional regulation, Facilitate acceptance of mental health diagnosis and concerns, Facilitate patient progression through stages of change regarding substance use diagnoses and concerns, Identify triggers associated with mental health/substance abuse issues, and Increase skills for wellness and recovery  Therapeutic Interventions: Assess for all discharge needs, 1 to 1 time with Social worker, Explore available resources and support systems, Assess for adequacy in community support network, Educate family and significant other(s) on suicide prevention, Complete Psychosocial Assessment, Interpersonal group therapy.  Evaluation of Outcomes: Not Progressing   Progress in Treatment: Attending groups: No. Participating in groups: No. Taking medication as prescribed: patient hasn't been prescribed any medications yet Toleration medication:  N/A Family/Significant other contact made: patient declined consents Patient understands diagnosis: Yes. Discussing patient identified problems/goals with staff: Yes. Medical problems stabilized or resolved: Yes. Denies suicidal/homicidal ideation: Yes. Issues/concerns per patient  self-inventory: No.  New problem(s) identified:  No  New Short Term/Long Term Goal(s):    medication  stabilization, elimination of SI thoughts, development of comprehensive mental wellness plan.   Patient Goals:  I want to learn new coping skills.     Discharge Plan or Barriers:  Patient recently admitted. CSW will continue to follow and assess for appropriate referrals and possible discharge planning.    Reason for Continuation of Hospitalization: Depression Medication stabilization Suicidal ideation  Estimated Length of Stay:  5 - 7 days  Last 3 [place] Suicide Severity Risk Score: Flowsheet Row [facility] (Current[facility] Most recent reading at 04/19/2024  5:42 PM ED from 04/19/2024 in [facility] Most recent reading at 04/19/2024 12:56 PM UC from 11/02/2023 in [facility]  Most recent reading at 11/02/2023  7:53 PM  C-SSRS RISK CATEGORY High Risk High Risk No Risk    Last PHQ 2/9 Scores:    04/13/2024   10:13 AM 06/09/2023    7:47 AM 10/22/2022    2:03 PM  Depression screen PHQ 2/9  Decreased Interest 3 2 0  Down, Depressed, Hopeless 2 2 0  PHQ - 2 Score 5 4 0  Altered sleeping 3 2 0  Tired, decreased energy 3 3 1   Change in appetite 0 0 0  Feeling bad or failure about yourself  3 3 0  Trouble concentrating 3 1 0  Moving slowly or fidgety/restless 2 0 0  Suicidal thoughts 1 1 0  PHQ-9 Score 20 14 1   Difficult doing work/chores Somewhat difficult Not difficult at all Not difficult at all    Scribe for Treatment Team: Rita Vialpando O Nevea Spiewak, LCSWA 04/20/2024 5:29 PM

## 2024-04-21 DIAGNOSIS — F332 Major depressive disorder, recurrent severe without psychotic features: Secondary | ICD-10-CM | POA: Diagnosis not present

## 2024-04-21 MED ORDER — ENSURE PLUS HIGH PROTEIN PO LIQD
237.0000 mL | Freq: Two times a day (BID) | ORAL | Status: DC
  Filled 2024-04-21 (×5): qty 237

## 2024-04-21 MED ORDER — VENLAFAXINE HCL ER 75 MG PO CP24
75.0000 mg | ORAL_CAPSULE | Freq: Every day | ORAL | Status: DC
  Administered 2024-04-21 – 2024-04-22 (×2): 75 mg via ORAL
  Filled 2024-04-21 (×2): qty 1

## 2024-04-21 NOTE — Progress Notes (Addendum)
 [facility] MD Progress Note  04/21/2024 1:38 PM Nicole Huff  MRN:  [id number]  Principal Problem: MDD (major depressive disorder), recurrent episode, severe (HCC) Diagnosis: Principal Problem:   MDD (major depressive disorder), recurrent episode, severe (HCC)  Reason for admission:  Nicole Huff is a 33 year old African-American female with prior psychiatric diagnoses significant for MDD, recurrent episode severe, adjustment reaction with anxiety and depression, and PMHx significant  for epigastric pain.  Patient presented voluntarily to [facility] from [facility] behavioral health urgent care for complaint of worsening depression resulting in suicidal ideation in the context of recent break-up with the boyfriend of 1.5 years.   24-Hour Chart Review: Patient's case discussed with the interdisciplinary team.  Vital signs without clinical values.  Trazodone  for insomnia required as needed.  Compliant with scheduled psychotropic medications.  Today's assessment notes: On assessment today, the pt reports that her mood is less depressed.  She rates both depression and anxiety as numbers 0/10, with 10 being high severity.  She presents alert, irritated and oriented to person, time, place, and situation.  Patient reports she came in voluntarily from the ED to obtain a one-to-one therapy session with the therapist at [facility].  Reports she is disappointed because this is not what she signed up for at the emergency room. Reports, at the emergency room, staff tricked me to coming to [facility] for therapy.  Became agitated and states she wanted to be discharged, as she already signed a 72-hour for discharge yesterday.  Reports that the nursing staff did not explained the process of admission to her.  Speech with ruminations and perseveration.  Made patient aware that we are acute hospital, and patient was admitted for medication management, mood stabilization, and safety.  Patient's mood  is labile and crying throughout this assessment.  She appears to be minimizing her depressive symptoms at this time. Effexor -XR increase from 37.5 mg to 75 mg po at bedtime for depression and anxiety. She denies delusional thinking or paranoia.  Further denies SI, HI, or AVH. Sleep is improving Appetite is fair Concentration is better  Energy level is adequate Denies suicidal thoughts. Further denies suicidal intent and plan.  Denies having any HI.  Denies having psychotic symptoms.   Denies having side effects to current psychiatric medications.   We discussed changes to current medication regimen, including increasing Effexor -XR from 37.5 mg to 75 mg po at bedtime for depression and anxiety. Pt is in agreement with medication adjustment.  Total Time spent with patient: 45 minutes  Past Psychiatric History: Previous Psych Diagnoses: Depression, anxiety, and suicidal attempts by overdosing with medication Prior inpatient treatment: Denies Current/prior outpatient treatment: Seeing a therapist at the [facility] health care Prior rehab hx: Denies Psychotherapy hx: Yes History of suicide: X 1, 2 years ago by overdosing on medications History of homicide or aggression: Denies Psychiatric medication history: Has been on trial of Effexor  and Lexapro  Psychiatric medication compliance history: Noncompliance Neuromodulation history: Denies Current Psychiatrist: Denies Current therapist: Seeing a therapist at the [place] health care  Past Medical History:  Past Medical History:  Diagnosis Date   Medical history non-contributory    Nausea vomiting and diarrhea 02/13/2021    Past Surgical History:  Procedure Laterality Date   NO PAST SURGERIES     Family History:  Family History  Problem Relation Age of Onset   Thyroid disease Mother    Thyroid disease Maternal Grandmother    Diabetes Maternal Grandfather    Family Psychiatric  History: See H&P Social History:  Social History    Substance and Sexual Activity  Alcohol Use Not Currently   Comment: denied all alcohol use     Social History   Substance and Sexual Activity  Drug Use No    Social History   Socioeconomic History   Marital status: Single    Spouse name: Not on file   Number of children: 0   Years of education: 12   Highest education level: Some college, no degree  Occupational History   Not on file  Tobacco Use   Smoking status: Never   Smokeless tobacco: Never  Vaping Use   Vaping status: Never Used  Substance and Sexual Activity   Alcohol use: Not Currently    Comment: denied all alcohol use   Drug use: No   Sexual activity: Not Currently    Birth control/protection: Implant, Pill    Comment: pt denied sexually active at this time, also denied birth control  Other Topics Concern   Not on file  Social History Narrative   Not on file   Social Drivers of Health   Financial Resource Strain: Not on file  Food Insecurity: No Food Insecurity (04/19/2024)   Hunger Vital Sign    Worried About Running Out of Food in the Last Year: Never true    Ran Out of Food in the Last Year: Never true  Transportation Needs: No Transportation Needs (04/19/2024)   PRAPARE - Administrator, Civil Service (Medical): No    Lack of Transportation (Non-Medical): No  Physical Activity: Not on file  Stress: Not on file  Social Connections: Not on file   Additional Social History:    Sleep: Good Estimated Sleeping Duration (Last 24 Hours): 7.50-7.75 hours  Appetite:  Good  Current Medications: Current Facility-Administered Medications  Medication Dose Route Frequency Provider Last Rate Last Admin   acetaminophen  (TYLENOL ) tablet 650 mg  650 mg Oral Q6H PRN Brent, Amanda C, NP       alum & mag hydroxide-simeth (MAALOX/MYLANTA) 200-200-20 MG/5ML suspension 30 mL  30 mL Oral Q4H PRN Brent, Amanda C, NP       haloperidol  (HALDOL ) tablet 5 mg  5 mg Oral TID PRN Brent, Amanda C, NP       And    diphenhydrAMINE  (BENADRYL ) capsule 50 mg  50 mg Oral TID PRN Brent, Amanda C, NP       haloperidol  lactate (HALDOL ) injection 5 mg  5 mg Intramuscular TID PRN Thresa Alan BROCKS, NP       And   diphenhydrAMINE  (BENADRYL ) injection 50 mg  50 mg Intramuscular TID PRN Thresa Alan BROCKS, NP       And   LORazepam  (ATIVAN ) injection 2 mg  2 mg Intramuscular TID PRN Brent, Amanda C, NP       haloperidol  lactate (HALDOL ) injection 10 mg  10 mg Intramuscular TID PRN Thresa Alan BROCKS, NP       And   diphenhydrAMINE  (BENADRYL ) injection 50 mg  50 mg Intramuscular TID PRN Brent, Amanda C, NP       And   LORazepam  (ATIVAN ) injection 2 mg  2 mg Intramuscular TID PRN Brent, Amanda C, NP       hydrOXYzine  (ATARAX ) tablet 25 mg  25 mg Oral TID PRN Brent, Amanda C, NP   25 mg at 04/19/24 2037   magnesium  hydroxide (MILK OF MAGNESIA) suspension 30 mL  30 mL Oral Daily PRN Brent, Amanda C, NP  traZODone  (DESYREL ) tablet 50 mg  50 mg Oral QHS PRN Brent, Amanda C, NP   50 mg at 04/20/24 2104   venlafaxine  XR (EFFEXOR -XR) 24 hr capsule 37.5 mg  37.5 mg Oral Q supper Brent, Amanda C, NP   37.5 mg at 04/20/24 1931   Lab Results: No results found for this or any previous visit (from the past 48 hours).  Blood Alcohol level:  Lab Results  Component Value Date   [facility] <15 04/19/2024   Metabolic Disorder Labs: Lab Results  Component Value Date   HGBA1C 4.6 (L) 04/19/2024   MPG 85.32 04/19/2024   No results found for: PROLACTIN Lab Results  Component Value Date   CHOL 191 04/19/2024   TRIG 50 04/19/2024   HDL 45 04/19/2024   CHOLHDL 4.2 04/19/2024   VLDL 10 04/19/2024   LDLCALC 136 (H) 04/19/2024   LDLCALC 130 (H) 06/20/2020   Physical Findings: AIMS:  ,  ,  ,  ,  ,  ,   CIWA:    COWS:     Musculoskeletal: Strength & Muscle Tone: within normal limits Gait & Station: normal Patient leans: N/A  Psychiatric Specialty Exam:  Presentation  General Appearance:  Appropriate for Environment  Eye  Contact: Good  Speech: Clear and Coherent; Normal Rate  Speech Volume: Normal  Handedness: Right  Mood and Affect  Mood: Labile  Affect: Congruent  Thought Process  Thought Processes: Coherent; Linear  Descriptions of Associations:Circumstantial  Orientation:Full (Time, Place and Person)  Thought Content:WDL  History of Schizophrenia/Schizoaffective disorder:No  Duration of Psychotic Symptoms:No data recorded Hallucinations:Hallucinations: None  Ideas of Reference:None  Suicidal Thoughts:Suicidal Thoughts: No (Patient appears to be minimizing her symptoms) SI Active Intent and/or Plan: -- (Denies) SI Passive Intent and/or Plan: -- (Denies)  Homicidal Thoughts:Homicidal Thoughts: No  Sensorium  Memory: Immediate Fair; Recent Fair  Judgment: Fair  Insight: Fair  Art therapist  Concentration: Fair  Attention Span: Fair  Recall: [employer] of Knowledge: Fair  Language: Fair  Psychomotor Activity  Psychomotor Activity: Psychomotor Activity: Normal  Assets  Assets: Communication Skills; Physical Health; Resilience  Sleep  Sleep: Sleep: Good Number of Hours of Sleep: 8  Physical Exam: Physical Exam Vitals and nursing note reviewed.  Constitutional:      Appearance: She is normal weight.  HENT:     Head: Normocephalic.     Right Ear: External ear normal.     Left Ear: External ear normal.     Nose: Nose normal.     Mouth/Throat:     Mouth: Mucous membranes are moist.     Pharynx: Oropharynx is clear.   Eyes:     Extraocular Movements: Extraocular movements intact.    Cardiovascular:     Rate and Rhythm: Normal rate.     Pulses: Normal pulses.  Pulmonary:     Effort: Pulmonary effort is normal.  Abdominal:     Comments: Deferred   Genitourinary:    Comments: Deferred    Musculoskeletal:        General: Normal range of motion.     Cervical back: Normal range of motion.   Skin:    General: Skin is warm.    Neurological:     General: No focal deficit present.     Mental Status: She is alert and oriented to person, place, and time.   Psychiatric:     Comments: Lability of mood    Review of Systems  Constitutional:  Negative for chills and  fever.  HENT:  Negative for sore throat.   Eyes:  Negative for blurred vision.  Respiratory:  Negative for cough, sputum production, shortness of breath and wheezing.   Cardiovascular:  Negative for chest pain and palpitations.  Gastrointestinal:  Negative for abdominal pain, constipation, diarrhea, heartburn, nausea and vomiting.  Genitourinary:  Negative for dysuria, frequency and urgency.  Musculoskeletal:  Negative for falls.  Skin:  Negative for itching and rash.  Neurological:  Negative for dizziness, seizures, loss of consciousness and headaches.  Endo/Heme/Allergies:        See allergy listing  Psychiatric/Behavioral:  Positive for depression. Negative for hallucinations, substance abuse and suicidal ideas. The patient is nervous/anxious. The patient does not have insomnia.    Blood pressure 111/80, pulse 70, temperature 98.3 F (36.8 C), temperature source Oral, resp. rate 14, height 5' 7 (1.702 m), weight 51.6 kg, last menstrual period 03/28/2024, SpO2 100%. Body mass index is 17.82 kg/m.  Treatment Plan Summary: Daily contact with patient to assess and evaluate symptoms and progress in treatment and Medication management   Physician Treatment Plan for Primary Diagnosis:  Assessment:  Nicole Huff is a 33 year old African-American female with prior psychiatric diagnoses significant for MDD, recurrent episode severe, adjustment reaction with anxiety and depression, and PMHx significant  for epigastric pain.  Patient presented voluntarily to [facility] from [facility] behavioral health urgent care for complaint of worsening depression resulting in suicidal ideation in the context of recent break-up with the  boyfriend of 1.5 years.    MDD (major depressive disorder), recurrent episode, severe (HCC)   Plans: Medications: --Increase Effexor  XR 24 hr capsule from 37.5 mg to 75 mg p.o. daily with supper for depression and anxiety --Continue hydroxyzine  tablet 25 mg p.o. 3 times daily as needed for anxiety --Continue Trazodone  Tablets 50 mg p.o. q. nightly as needed for insomnia   Other PRN Medications  -Acetaminophen  650 mg every 6 as needed/mild pain  -Maalox 30 mL oral every 4 as needed/digestion  -Magnesium  hydroxide 30 mL daily as needed/mild constipation    --The risks/benefits/side-effects/alternatives to this medication were discussed in detail with the patient and time was given for questions. The patient consents to medication trial.   -- Metabolic profile and EKG monitoring obtained while on an atypical antipsychotic (BMI: Lipid Panel: HbgA1c: QTc:)   -- Encouraged patient to participate in unit milieu and in scheduled group therapies    Continue BH Agitation Protocol  --Haldol  5 mg, oral, 3 times daily as needed, mild agitation  --Benadryl  50 mg, oral, 3 times daily as needed, mild agitation                                      OR   --Haldol  injection 5 mg, IM, 3 times daily as needed, moderate agitation  --Benadryl  injection 50 mg, IM, 3 times daily as needed, moderate agitation  --Ativan  injection 2 mg, IM, 3 times daily as needed, moderate agitation                                      OR  --Haldol  injection 10 mg, IM, 3 times daily as needed, severe agitation  --Benadryl  injection 50 mg, IM, 3 times daily as needed, severe agitation  --Ativan  injection 2 mg, IM, 3 times daily as needed,  severe agitation    Admission labs reviewed: CMP: Glucose 63, total bilirubin 1.4, otherwise normal.  Lipid panel: LDL 136, otherwise normal.  CBC with differential: All within normal limits.  Hemoglobin A1c 4.6.  TSH: 0.939 within normal limits.  UDS: No substances indicated.   New labs  ordered: None   EKG reviewed: Normal sinus rhythm, ventricular rate 66, QT/QTc 396/415   Safety and Monitoring:  Voluntary admission to inpatient psychiatric unit for safety, stabilization and treatment  Daily contact with patient to assess and evaluate symptoms and progress in treatment  Patient's case to be discussed in multi-disciplinary team meeting  Observation Level : q15 minute checks  Vital signs: q12 hours  Precautions: suicide, but pt currently verbally contracts for safety on unit?    Discharge Planning:  Social work and case management to assist with discharge planning and identification of hospital follow-up needs prior to discharge  Estimated LOS: 5-7?days  Discharge Concerns: Need to establish a safety plan; Medication compliance and effectiveness  Discharge Goals: Return home with outpatient referrals for mental health follow-up including medication management/psychotherapy.    Long Term Goal(s): Improvement in symptoms so as ready for discharge   Short Term Goals: Ability to identify changes in lifestyle to reduce recurrence of condition will improve, Ability to verbalize feelings will improve, Ability to disclose and discuss suicidal ideas, Ability to demonstrate self-control will improve, Ability to identify and develop effective coping behaviors will improve, Ability to maintain clinical measurements within normal limits will improve, Compliance with prescribed medications will improve, and Ability to identify triggers associated with substance abuse/mental health issues will improve   Physician Treatment Plan for Secondary Diagnosis: Principal Problem:   MDD (major depressive disorder), recurrent episode, severe (HCC)    Ellouise JAYSON Azure, FNP 04/21/2024, 1:38 PM

## 2024-04-21 NOTE — Progress Notes (Signed)
   04/21/24 0910  Psych Admission Type (Psych Patients Only)  Admission Status Voluntary/72 hour document signed  Psychosocial Assessment  Patient Complaints Anxiety;Worrying  Eye Contact Fair  Facial Expression Anxious;Worried  Affect Anxious;Preoccupied  Surveyor, minerals Activity Other (Comment) (WNL)  Appearance/Hygiene Unremarkable  Behavior Characteristics Anxious  Mood Anxious;Preoccupied  Thought Process  Coherency WDL  Content WDL  Delusions None reported or observed  Perception WDL  Hallucination None reported or observed  Judgment Impaired  Confusion None  Danger to Self  Current suicidal ideation? Denies  Self-Injurious Behavior No self-injurious ideation or behavior indicators observed or expressed   Agreement Not to Harm Self Yes  Description of Agreement Verbal  Danger to Others  Danger to Others None reported or observed

## 2024-04-21 NOTE — Group Note (Signed)
 Date:  04/21/2024 Time:  10:47 PM  Group Topic/Focus:  Wrap-Up Group:   The focus of this group is to help patients review their daily goal of treatment and discuss progress on daily workbooks.    Participation Level:  Active  Participation Quality:  Appropriate  Affect:  Appropriate  Cognitive:  Appropriate  Insight: Appropriate  Engagement in Group:  Engaged  Modes of Intervention:  Discussion  Additional Comments:  Patient participated in group today.  Patient shared appropriately   Bari Moats 04/21/2024, 10:47 PM

## 2024-04-21 NOTE — Plan of Care (Signed)
 Patient complaint with treatment plan. Focused on discharge. Denies SI/HI/A/VH and verbally contracts for safety. Patient interacting well with Peers and Staff.  Problem: Coping: Goal: Ability to verbalize frustrations and anger appropriately will improve Outcome: Progressing Goal: Ability to demonstrate self-control will improve Outcome: Progressing   Problem: Health Behavior/Discharge Planning: Goal: Identification of resources available to assist in meeting health care needs will improve Outcome: Progressing

## 2024-04-21 NOTE — Group Note (Signed)
 [facility] LCSW Group Therapy Note  @TD @  10:00-11:00AM Type of Therapy and Topic:  Group Therapy:  Safety Participation Level:  Active   Description of Group:  Patients in this group were introduced the patient's safety;  the treatment  revolves around one central idea; you need to stay safe. The patient will learn on how to cope safely, no matter what negative life events come their way. The patient will identify methods on how they cope safely vs unsafe.  The patient will understand the three stages of healing: Safety, Mourning and Reconnection.  Patients discussed what additional healthy supports could be helpful in their recovery and wellness after discharge in order to prevent future hospitalizations.   An emphasis was placed on following up with the discharge plan when they leave the hospital to continue becoming healthier and happier.   Therapeutic Goals: 1)  Understand the patient's safety  2)  Stages of Safety  3)  Safe Vs. Unsafe   4) Support   Summary of Patient Progress:  The patient expressed her comprehension of the concepts presented, and that she feel safe when understand the impact of her surrounding  Current supports include families.  The patient indicated one coping skills that could be helpful if added would be take a walk.  She participated by committing to a worksheet  and sharing her thoughts.  Therapeutic Modalities:   Psychoeducation Brief Solution-Focused Therapy  Genowefa Morga O Ariany Kesselman, LCSWA 04/21/2024  3:05 PM

## 2024-04-21 NOTE — Plan of Care (Signed)
   Problem: Education: Goal: Emotional status will improve Outcome: Progressing   Problem: Education: Goal: Verbalization of understanding the information provided will improve Outcome: Progressing

## 2024-04-21 NOTE — Progress Notes (Signed)
   04/21/24 0555  15 Minute Checks  Location Bedroom  Visual Appearance Calm  Behavior Sleeping  Sleep (Behavioral Health Patients Only)  Calculate sleep? (Click Yes once per 24 hr at 0600 safety check) Yes  Documented sleep last 24 hours 8

## 2024-04-21 NOTE — Plan of Care (Signed)
  Problem: Activity: Goal: Interest or engagement in activities will improve Outcome: Progressing Goal: Sleeping patterns will improve Outcome: Progressing   Problem: Physical Regulation: Goal: Ability to maintain clinical measurements within normal limits will improve Outcome: Progressing   Problem: Safety: Goal: Periods of time without injury will increase Outcome: Progressing

## 2024-04-22 DIAGNOSIS — F332 Major depressive disorder, recurrent severe without psychotic features: Secondary | ICD-10-CM | POA: Diagnosis not present

## 2024-04-22 NOTE — Plan of Care (Signed)
   Problem: Education: Goal: Emotional status will improve Outcome: Progressing Goal: Mental status will improve Outcome: Progressing   Problem: Activity: Goal: Interest or engagement in activities will improve Outcome: Progressing Goal: Sleeping patterns will improve Outcome: Progressing

## 2024-04-22 NOTE — Progress Notes (Signed)
 [facility] MD Progress Note  04/22/2024 2:48 PM Nicole Huff  MRN:  [id number]  Principal Problem: MDD (major depressive disorder), recurrent episode, severe (HCC) Diagnosis: Principal Problem:   MDD (major depressive disorder), recurrent episode, severe (HCC)  Reason for admission:  Nicole Huff is a 33 year old African-American female with prior psychiatric diagnoses significant for MDD, recurrent episode severe, adjustment reaction with anxiety and depression, and PMHx significant  for epigastric pain.  Patient presented voluntarily to [facility] from [facility] behavioral health urgent care for complaint of worsening depression resulting in suicidal ideation in the context of recent break-up with the boyfriend of 1.5 years.   Daily notes: Nicole Huff is seen this morning. Chart reviewed. The chart findings discussed with the treatment team during the team meeting this afternoon. She presents alert, oriented & aware of situation. She is visible on the unit, attending group sessions. She presents with an improving affect, good eye contact & verbally responsive. She reports, I have been in this hospital x 3 days. I came in because I was feeling very depressed, down & out  with a lot of anxiety. These symptoms were triggered by a relationship break-up. But I'm starting to feel good now. I'm currently taking Effexor  XR, started a week ago. I'm sleeping well.. My appetite is good. I have been attending group sessions, but I'm not getting anything that is useful from the group for me. I came to the hospital thinking that I will have a breakthrough 1:1 Therapy, but that is not the case here. So, I signed a 72 hour discharge notice & it will be due tomorrow. I have an appointment scheduled with a therapist for after discharge therapy. I can't wait for tomorrow to get here because my 72 hours discharge notice will be due. Nicole Huff currently denies any SIHI, AVH, delusional thoughts  or paranoia. She does not appear to be responding to any internal stimuli. There are no changes made on the current plan of care. Continue as already in progress. Vital signs remain stable. May discharge patient tomorrow 04-23-24.  Total Time spent with patient: 35 minutes.  Past Psychiatric History: Previous Psych Diagnoses: Depression, anxiety, and suicidal attempts by overdosing with medication Prior inpatient treatment: Denies Current/prior outpatient treatment: Seeing a therapist at the [facility] health care Prior rehab hx: Denies Psychotherapy hx: Yes History of suicide: X 1, 2 years ago by overdosing on medications History of homicide or aggression: Denies Psychiatric medication history: Has been on trial of Effexor  and Lexapro  Psychiatric medication compliance history: Noncompliance Neuromodulation history: Denies Current Psychiatrist: Denies Current therapist: Seeing a therapist at the [facility] health care  Past Medical History:  Past Medical History:  Diagnosis Date   Medical history non-contributory    Nausea vomiting and diarrhea 02/13/2021    Past Surgical History:  Procedure Laterality Date   NO PAST SURGERIES     Family History:  Family History  Problem Relation Age of Onset   Thyroid disease Mother    Thyroid disease Maternal Grandmother    Diabetes Maternal Grandfather    Family Psychiatric  History: See H&P Social History:  Social History   Substance and Sexual Activity  Alcohol Use Not Currently   Comment: denied all alcohol use     Social History   Substance and Sexual Activity  Drug Use No    Social History   Socioeconomic History   Marital status: Single    Spouse name: Not on file   Number of children:  0   Years of education: 32   Highest education level: Some college, no degree  Occupational History   Not on file  Tobacco Use   Smoking status: Never   Smokeless tobacco: Never  Vaping Use   Vaping status: Never Used  Substance and  Sexual Activity   Alcohol use: Not Currently    Comment: denied all alcohol use   Drug use: No   Sexual activity: Not Currently    Birth control/protection: Implant, Pill    Comment: pt denied sexually active at this time, also denied birth control  Other Topics Concern   Not on file  Social History Narrative   Not on file   Social Drivers of Health   Financial Resource Strain: Not on file  Food Insecurity: No Food Insecurity (04/19/2024)   Hunger Vital Sign    Worried About Running Out of Food in the Last Year: Never true    Ran Out of Food in the Last Year: Never true  Transportation Needs: No Transportation Needs (04/19/2024)   PRAPARE - Administrator, Civil Service (Medical): No    Lack of Transportation (Non-Medical): No  Physical Activity: Not on file  Stress: Not on file  Social Connections: Not on file   Additional Social History:    Sleep: Good Estimated Sleeping Duration (Last 24 Hours): 5.00-6.50 hours  Appetite:  Good  Current Medications: Current Facility-Administered Medications  Medication Dose Route Frequency Provider Last Rate Last Admin   acetaminophen  (TYLENOL ) tablet 650 mg  650 mg Oral Q6H PRN Brent, Amanda C, NP   650 mg at 04/22/24 1240   alum & mag hydroxide-simeth (MAALOX/MYLANTA) 200-200-20 MG/5ML suspension 30 mL  30 mL Oral Q4H PRN Brent, Amanda C, NP       haloperidol  (HALDOL ) tablet 5 mg  5 mg Oral TID PRN Brent, Amanda C, NP       And   diphenhydrAMINE  (BENADRYL ) capsule 50 mg  50 mg Oral TID PRN Brent, Amanda C, NP       haloperidol  lactate (HALDOL ) injection 5 mg  5 mg Intramuscular TID PRN Brent, Amanda C, NP       And   diphenhydrAMINE  (BENADRYL ) injection 50 mg  50 mg Intramuscular TID PRN Brent, Amanda C, NP       And   LORazepam  (ATIVAN ) injection 2 mg  2 mg Intramuscular TID PRN Brent, Amanda C, NP       haloperidol  lactate (HALDOL ) injection 10 mg  10 mg Intramuscular TID PRN Thresa Alan BROCKS, NP       And    diphenhydrAMINE  (BENADRYL ) injection 50 mg  50 mg Intramuscular TID PRN Thresa Alan BROCKS, NP       And   LORazepam  (ATIVAN ) injection 2 mg  2 mg Intramuscular TID PRN Brent, Amanda C, NP       feeding supplement (ENSURE PLUS HIGH PROTEIN) liquid 237 mL  237 mL Oral BID BM Bobbitt, Shalon E, NP       hydrOXYzine  (ATARAX ) tablet 25 mg  25 mg Oral TID PRN Brent, Amanda C, NP   25 mg at 04/19/24 2037   magnesium  hydroxide (MILK OF MAGNESIA) suspension 30 mL  30 mL Oral Daily PRN Brent, Amanda C, NP       traZODone  (DESYREL ) tablet 50 mg  50 mg Oral QHS PRN Brent, Amanda C, NP   50 mg at 04/21/24 2106   venlafaxine  XR (EFFEXOR -XR) 24 hr capsule 75 mg  75 mg  Oral Q supper Ntuen, Tina C, FNP   75 mg at 04/21/24 8061   Lab Results: No results found for this or any previous visit (from the past 48 hours).  Blood Alcohol level:  Lab Results  Component Value Date   [facility] <15 04/19/2024   Metabolic Disorder Labs: Lab Results  Component Value Date   HGBA1C 4.6 (L) 04/19/2024   MPG 85.32 04/19/2024   No results found for: PROLACTIN Lab Results  Component Value Date   CHOL 191 04/19/2024   TRIG 50 04/19/2024   HDL 45 04/19/2024   CHOLHDL 4.2 04/19/2024   VLDL 10 04/19/2024   LDLCALC 136 (H) 04/19/2024   LDLCALC 130 (H) 06/20/2020   Physical Findings: AIMS:  ,  ,  ,  ,  ,  ,   CIWA:    COWS:     Musculoskeletal: Strength & Muscle Tone: within normal limits Gait & Station: normal Patient leans: N/A  Psychiatric Specialty Exam:  Presentation  General Appearance:  Appropriate for Environment  Eye Contact: Good  Speech: Clear and Coherent; Normal Rate  Speech Volume: Normal  Handedness: Right  Mood and Affect  Mood: Labile  Affect: Congruent  Thought Process  Thought Processes: Coherent; Linear  Descriptions of Associations:Circumstantial  Orientation:Full (Time, Place and Person)  Thought Content:WDL  History of Schizophrenia/Schizoaffective  disorder:No  Duration of Psychotic Symptoms:No data recorded Hallucinations:Hallucinations: None  Ideas of Reference:None  Suicidal Thoughts:Suicidal Thoughts: No (Patient appears to be minimizing her symptoms) SI Active Intent and/or Plan: -- (Denies) SI Passive Intent and/or Plan: -- (Denies)  Homicidal Thoughts:Homicidal Thoughts: No  Sensorium  Memory: Immediate Fair; Recent Fair  Judgment: Fair  Insight: Fair  Art therapist  Concentration: Fair  Attention Span: Fair  Recall: [employer] of Knowledge: Fair  Language: Fair  Psychomotor Activity  Psychomotor Activity: Psychomotor Activity: Normal  Assets  Assets: Communication Skills; Physical Health; Resilience  Sleep  Sleep: Sleep: Good Number of Hours of Sleep: 8  Physical Exam: Physical Exam Vitals and nursing note reviewed.  Constitutional:      Appearance: She is normal weight.  HENT:     Head: Normocephalic.     Right Ear: External ear normal.     Left Ear: External ear normal.     Nose: Nose normal.     Mouth/Throat:     Mouth: Mucous membranes are moist.     Pharynx: Oropharynx is clear.   Eyes:     Extraocular Movements: Extraocular movements intact.    Cardiovascular:     Rate and Rhythm: Normal rate.     Pulses: Normal pulses.  Pulmonary:     Effort: Pulmonary effort is normal.  Abdominal:     Comments: Deferred   Genitourinary:    Comments: Deferred    Musculoskeletal:        General: Normal range of motion.     Cervical back: Normal range of motion.   Skin:    General: Skin is warm.   Neurological:     General: No focal deficit present.     Mental Status: She is alert and oriented to person, place, and time.   Psychiatric:     Comments: Lability of mood    Review of Systems  Constitutional:  Negative for chills and fever.  HENT:  Negative for sore throat.   Eyes:  Negative for blurred vision.  Respiratory:  Negative for cough, sputum production,  shortness of breath and wheezing.   Cardiovascular:  Negative for  chest pain and palpitations.  Gastrointestinal:  Negative for abdominal pain, constipation, diarrhea, heartburn, nausea and vomiting.  Genitourinary:  Negative for dysuria, frequency and urgency.  Musculoskeletal:  Negative for falls.  Skin:  Negative for itching and rash.  Neurological:  Negative for dizziness, seizures, loss of consciousness and headaches.  Endo/Heme/Allergies:        See allergy listing  Psychiatric/Behavioral:  Positive for depression. Negative for hallucinations, substance abuse and suicidal ideas. The patient is nervous/anxious. The patient does not have insomnia.    Blood pressure 111/80, pulse 80, temperature 98.3 F (36.8 C), temperature source Oral, resp. rate 16, height 5' 7 (1.702 m), weight 51.6 kg, last menstrual period 03/28/2024, SpO2 100%. Body mass index is 17.82 kg/m.  Treatment Plan Summary: Daily contact with patient to assess and evaluate symptoms and progress in treatment and Medication management   Physician Treatment Plan for Primary Diagnosis:  MDD (major depressive disorder), recurrent episode, severe (HCC)   Plans: Medications: --Continue Effexor  XR 75 mg p.o. daily with supper for dep/anxiety --Continue hydroxyzine  tablet 25 mg p.o. 3 times daily as needed for anxiety --Continue Trazodone  Tablets 50 mg p.o. q. nightly as needed for insomnia   Other PRN Medications  -Acetaminophen  650 mg every 6 as needed/mild pain  -Maalox 30 mL oral every 4 as needed/digestion  -Magnesium  hydroxide 30 mL daily as needed/mild constipation    --The risks/benefits/side-effects/alternatives to this medication were discussed in detail with the patient and time was given for questions. The patient consents to medication trial.   -- Metabolic profile and EKG monitoring obtained while on an atypical antipsychotic (BMI: Lipid Panel: HbgA1c: QTc:)   -- Encouraged patient to participate in unit  milieu and in scheduled group therapies    Continue BH Agitation Protocol  --Haldol  5 mg, oral, 3 times daily as needed, mild agitation  --Benadryl  50 mg, oral, 3 times daily as needed, mild agitation                                      OR   --Haldol  injection 5 mg, IM, 3 times daily as needed, moderate agitation  --Benadryl  injection 50 mg, IM, 3 times daily as needed, moderate agitation  --Ativan  injection 2 mg, IM, 3 times daily as needed, moderate agitation                                      OR  --Haldol  injection 10 mg, IM, 3 times daily as needed, severe agitation  --Benadryl  injection 50 mg, IM, 3 times daily as needed, severe agitation  --Ativan  injection 2 mg, IM, 3 times daily as needed, severe agitation    Admission labs reviewed: CMP: Glucose 63, total bilirubin 1.4, otherwise normal.  Lipid panel: LDL 136, otherwise normal.  CBC with differential: All within normal limits.  Hemoglobin A1c 4.6.  TSH: 0.939 within normal limits.  UDS: No substances indicated.   New labs ordered: None   EKG reviewed: Normal sinus rhythm, ventricular rate 66, QT/QTc 396/415   Safety and Monitoring:  Voluntary admission to inpatient psychiatric unit for safety, stabilization and treatment  Daily contact with patient to assess and evaluate symptoms and progress in treatment  Patient's case to be discussed in multi-disciplinary team meeting  Observation Level : q15 minute checks  Vital signs: q12 hours  Precautions: suicide, but pt currently verbally contracts for safety on unit?    Discharge Planning:  Social work and case management to assist with discharge planning and identification of hospital follow-up needs prior to discharge  Estimated LOS: 5-7?days  Discharge Concerns: Need to establish a safety plan; Medication compliance and effectiveness  Discharge Goals: Return home with outpatient referrals for mental health follow-up including medication management/psychotherapy.    Mac Bolster, NP, pmhnp, fnp-bc. 04/22/2024, 2:48 PM Patient ID: Nicole Huff, female   DOB: [birth year], 33 y.o.   MRN: [id number]

## 2024-04-22 NOTE — Group Note (Signed)
 Date:  04/22/2024 Time:  1:00 PM  Group Topic/Focus:  Healthy Communication:   The focus of this group is to discuss communication, barriers to communication, as well as healthy ways to communicate with others.    Participation Level:  Minimal  Participation Quality:  Appropriate  Affect:  Appropriate  Cognitive:  Appropriate  Insight: Appropriate  Engagement in Group:  Limited  Modes of Intervention:  Discussion  Additional Comments:    Annelies Coyt D Irbin Fines 04/22/2024, 1:00 PM

## 2024-04-22 NOTE — Group Note (Signed)
 Date:  04/22/2024 Time:  5:19 PM  Group Topic/Focus:  Goals Group:   The focus of this group is to help patients establish daily goals to achieve during treatment and discuss how the patient can incorporate goal setting into their daily lives to aide in recovery. Orientation:   The focus of this group is to educate the patient on the purpose and policies of crisis stabilization and provide a format to answer questions about their admission.  The group details unit policies and expectations of patients while admitted.    Participation Level:  Did Not Attend  Nicole Huff 04/22/2024, 5:19 PM

## 2024-04-22 NOTE — Progress Notes (Signed)
   04/22/24 0925  Psych Admission Type (Psych Patients Only)  Admission Status Voluntary/72 hour document signed  Psychosocial Assessment  Patient Complaints Anxiety  Eye Contact Fair  Facial Expression Anxious;Worried  Affect Anxious  Sport and exercise psychologist Cooperative;Anxious  Mood Anxious  Thought Process  Coherency WDL  Content WDL  Delusions None reported or observed  Perception WDL  Hallucination None reported or observed  Judgment Impaired  Confusion None  Danger to Self  Current suicidal ideation? Denies  Description of Suicide Plan No plan  Self-Injurious Behavior No self-injurious ideation or behavior indicators observed or expressed   Agreement Not to Harm Self Yes  Description of Agreement verbal  Danger to Others  Danger to Others None reported or observed

## 2024-04-22 NOTE — Plan of Care (Signed)
   Problem: Education: Goal: Knowledge of [facility] Education information/materials will improve Outcome: Progressing   Problem: Activity: Goal: Interest or engagement in activities will improve Outcome: Progressing   Problem: Coping: Goal: Ability to verbalize frustrations and anger appropriately will improve Outcome: Progressing

## 2024-04-23 DIAGNOSIS — F332 Major depressive disorder, recurrent severe without psychotic features: Secondary | ICD-10-CM | POA: Diagnosis not present

## 2024-04-23 MED ORDER — VENLAFAXINE HCL ER 75 MG PO CP24: 75.0000 mg | ORAL_CAPSULE | Freq: Every day | ORAL | 0 refills | Status: DC

## 2024-04-23 MED ORDER — HYDROXYZINE HCL 25 MG PO TABS: 25.0000 mg | ORAL_TABLET | Freq: Three times a day (TID) | ORAL | 0 refills | Status: DC | PRN

## 2024-04-23 MED ORDER — TRAZODONE HCL 50 MG PO TABS: 50.0000 mg | ORAL_TABLET | Freq: Every evening | ORAL | 0 refills | Status: AC | PRN

## 2024-04-23 NOTE — Progress Notes (Signed)
  [facility] Adult Case Management Discharge Plan :  Will you be returning to the same living situation after discharge:  Yes,  [address] [place]  At discharge, do you have transportation home?: Yes,  The patient grandma will provide transport. Do you have the ability to pay for your medications: Yes,  The patient stated yes.  Release of information consent forms completed and in the chart;  Patient's signature needed at discharge.  Patient to Follow up at:  Follow-up Information     [facility] at [facility] Follow up on 2/82/7974.   Specialty: Behavioral Health Why: You have an appointment for therapy services on 05/10/24 at 3:00 pm. Contact information: [address] [place]  [address] [phone]        [facility] Follow up on 04/24/2024.   Why: Please call this provider on 04/24/24 at 9:00 am to schedule an appointment for  medication management services, as we were unable to contact the provider prior to your discharge. Contact information: [address] [place] [address] [phone]                 Next level of care provider has access to [facility] Link:yes  Safety Planning and Suicide Prevention discussed: Yes,  Discussed with patient because she refused to provide consent.      Has patient been referred to the Quitline?: Patient refused referral for treatment  Patient has been referred for addiction treatment: Yes, the patient will follow up with an outpatient provider for substance use disorder. Psychiatrist/APP: appointment made  Roselyn GORMAN Lento, LCSW 04/23/2024, 12:00 PM

## 2024-04-23 NOTE — Discharge Summary (Signed)
 Physician Discharge Summary Note  Patient:  Nicole Huff is an 33 y.o., female MRN:  [id number] DOB:  1991-06-13 Patient phone:  [phone] (home)  Patient address:   [address] [place] [address],  Total Time spent with patient: 45 minutes  Date of Admission:  04/19/2024 Date of Discharge:   04/23/2024  Reason for Admission:   Nicole Huff is a 33 year old African-American female with prior psychiatric diagnoses significant for MDD, recurrent episode severe, adjustment reaction with anxiety and depression, and PMHx significant  for epigastric pain.  Patient presented voluntarily to [facility] from [facility] behavioral health urgent care for complaint of worsening depression resulting in suicidal ideation in the context of recent break-up with the boyfriend of 1.5 years.   Principal Problem: MDD (major depressive disorder), recurrent episode, severe (HCC) Discharge Diagnoses: Principal Problem:   MDD (major depressive disorder), recurrent episode, severe (HCC)  Past Psychiatric History: Previous Psych Diagnoses: Depression, anxiety, and suicidal attempts by overdosing with medication Prior inpatient treatment: Denies Current/prior outpatient treatment: Seeing a therapist at the [facility] health care Prior rehab hx: Denies Psychotherapy hx: Yes History of suicide: X 1, 2 years ago by overdosing on medications History of homicide or aggression: Denies Psychiatric medication history: Has been on trial of Effexor  and Lexapro  Psychiatric medication compliance history: Noncompliance Neuromodulation history: Denies Current Psychiatrist: Denies Current therapist: Seeing a therapist at the [facility] health care  Past Medical History:  Past Medical History:  Diagnosis Date   Medical history non-contributory    Nausea vomiting and diarrhea 02/13/2021    Past Surgical History:  Procedure Laterality Date   NO PAST SURGERIES     Family  History:  Family History  Problem Relation Age of Onset   Thyroid disease Mother    Thyroid disease Maternal Grandmother    Diabetes Maternal Grandfather    Family Psychiatric  History: See H&P Social History:  Social History   Substance and Sexual Activity  Alcohol Use Not Currently   Comment: denied all alcohol use     Social History   Substance and Sexual Activity  Drug Use No    Social History   Socioeconomic History   Marital status: Single    Spouse name: Not on file   Number of children: 0   Years of education: 12   Highest education level: Some college, no degree  Occupational History   Not on file  Tobacco Use   Smoking status: Never   Smokeless tobacco: Never  Vaping Use   Vaping status: Never Used  Substance and Sexual Activity   Alcohol use: Not Currently    Comment: denied all alcohol use   Drug use: No   Sexual activity: Not Currently    Birth control/protection: Implant, Pill    Comment: pt denied sexually active at this time, also denied birth control  Other Topics Concern   Not on file  Social History Narrative   Not on file   Social Drivers of Health   Financial Resource Strain: Not on file  Food Insecurity: No Food Insecurity (04/19/2024)   Hunger Vital Sign    Worried About Running Out of Food in the Last Year: Never true    Ran Out of Food in the Last Year: Never true  Transportation Needs: Unmet Transportation Needs (04/23/2024)   PRAPARE - Administrator, Civil Service (Medical): Not on file    Lack of Transportation (Non-Medical): Yes  Physical Activity: Not on file  Stress: Not on file  Social Connections: Not on file   Hospital Course:  During the patient's hospitalization, patient had extensive initial psychiatric evaluation, and follow-up psychiatric evaluations every day.  Psychiatric diagnoses provided upon initial assessment:  Diagnosis:  Principal Problem:  MDD (major depressive disorder), recurrent episode,  severe (HCC) Adjustment disorder with anxiety and depression  Patient's psychiatric medications were adjusted on admission:  --Continue Effexor  XR 24 hr capsule 37.5 mg p.o. daily with supper for depression and anxiety -- Continue hydroxyzine  tablet 25 mg p.o. 3 times daily as needed for anxiety -- Continue Trazodone  Tablets 50 mg p.o. q. nightly as needed for insomnia  During the hospitalization, other adjustments were made to the patient's psychiatric medication regimen:  Effexor  XR 24-hour capsule was increased from 37.5 mg to 75 mg p.o. q. nightly for MDD and anxiety  Patient's care was discussed during the interdisciplinary team meeting every day during the hospitalization.  The patient denies having side effects to prescribed psychiatric medication.  Gradually, patient started adjusting to milieu. The patient was evaluated each day by a clinical provider to ascertain response to treatment. Improvement was noted by the patient's report of decreasing symptoms, improved sleep and appetite, affect, medication tolerance, behavior, and participation in unit programming.  Patient was asked each day to complete a self inventory noting mood, mental status, pain, new symptoms, anxiety and concerns.    Symptoms were reported as significantly decreased or resolved completely by discharge.   On day of discharge, the patient reports that their mood is stable. The patient denied having suicidal thoughts for more than 48 hours prior to discharge.  Patient denies having homicidal thoughts.  Patient denies having auditory hallucinations.  Patient denies any visual hallucinations or other symptoms of psychosis. The patient was motivated to continue taking medication with a goal of continued improvement in mental health.   The patient reports her target psychiatric symptoms of mood disorder responded well to the psychiatric medications, and the patient reports overall benefit other psychiatric  hospitalization. Supportive psychotherapy was provided to the patient. The patient also participated in regular group therapy while hospitalized. Coping skills, problem solving as well as relaxation therapies were also part of the unit programming.  Labs were reviewed with the patient, and abnormal results were discussed with the patient.  The patient is able to verbalize their individual safety plan to this provider.  # It is recommended to the patient to continue psychiatric medications as prescribed, after discharge from the hospital.    # It is recommended to the patient to follow up with your outpatient psychiatric provider and PCP.  # It was discussed with the patient, the impact of alcohol, drugs, tobacco have been there overall psychiatric and medical wellbeing, and total abstinence from substance use was recommended the patient.ed.  # Prescriptions provided or sent directly to preferred pharmacy at discharge. Patient agreeable to plan. Given opportunity to ask questions. Appears to feel comfortable with discharge.    # In the event of worsening symptoms, the patient is instructed to call the crisis hotline, 911 and or go to the nearest ED for appropriate evaluation and treatment of symptoms. To follow-up with primary care provider for other medical issues, concerns and or health care needs  # Patient was discharged to home with her mother with a plan to follow up as noted below.   Addendum: During hospitalization, patient's mother Macario at [phone] and grandmother visited regulary.  Prior to discharge, patient and mother called  by this provider and she states that patient is stable enough and her symptoms are minimized greatly and improve enough to be returned home.  Added that she will monitor patient for safety while at home.  Education provided for patient to keep taking her psychotropic medications to prevent relapse.  Patient denies any dangerousness of SI, HI, or AVH.  Physical  Findings: AIMS:  , ,  ,  ,  ,  ,   CIWA:    COWS:     Musculoskeletal: Strength & Muscle Tone: within normal limits Gait & Station: normal Patient leans: N/A  Psychiatric Specialty Exam:  Presentation  General Appearance:  Appropriate for Environment; Casual; Fairly Groomed  Eye Contact: Good  Speech: Clear and Coherent  Speech Volume: Normal  Handedness: Right  Mood and Affect  Mood: Euthymic  Affect: Congruent  Thought Process  Thought Processes: Coherent  Descriptions of Associations:Intact  Orientation:Full (Time, Place and Person)  Thought Content:Logical  History of Schizophrenia/Schizoaffective disorder:No  Duration of Psychotic Symptoms:No data recorded Hallucinations:Hallucinations: None  Ideas of Reference:None  Suicidal Thoughts:Suicidal Thoughts: No SI Active Intent and/or Plan: -- (Denies) SI Passive Intent and/or Plan: -- (Denies)  Homicidal Thoughts:Homicidal Thoughts: No  Sensorium  Memory: Immediate Good; Recent Good  Judgment: Fair  Insight: Fair  Art therapist  Concentration: Good  Attention Span: Good  Recall: Fair  Fund of Knowledge: Fair  Language: Good  Psychomotor Activity  Psychomotor Activity: Psychomotor Activity: Normal  Assets  Assets: Communication Skills; Desire for Improvement; Physical Health; Resilience; Social Support  Sleep  Sleep: Sleep: Good  Estimated Sleeping Duration (Last 24 Hours): 7.50-8.00 hours  Physical Exam: Physical Exam Vitals and nursing note reviewed.  Constitutional:      General: She is not in acute distress.    Appearance: She is normal weight. She is not ill-appearing.  HENT:     Head: Normocephalic.     Right Ear: External ear normal.     Left Ear: External ear normal.     Nose: Nose normal.     Mouth/Throat:     Mouth: Mucous membranes are moist.     Pharynx: Oropharynx is clear.   Eyes:     Extraocular Movements: Extraocular movements  intact.    Cardiovascular:     Rate and Rhythm: Normal rate.     Pulses: Normal pulses.  Pulmonary:     Effort: Pulmonary effort is normal. No respiratory distress.  Abdominal:     Comments: Deferred  Genitourinary:    Comments: Deferred  Musculoskeletal:        General: Normal range of motion.     Cervical back: Normal range of motion.   Skin:    General: Skin is warm.   Neurological:     General: No focal deficit present.     Mental Status: She is alert and oriented to person, place, and time.   Psychiatric:        Mood and Affect: Mood normal.        Behavior: Behavior normal.        Thought Content: Thought content normal.    Review of Systems  Constitutional:  Negative for chills and fever.  HENT:  Negative for sore throat.   Eyes:  Negative for blurred vision.  Respiratory:  Negative for wheezing.   Cardiovascular:  Negative for chest pain and palpitations.  Gastrointestinal:  Negative for abdominal pain, constipation, diarrhea, heartburn, nausea and vomiting.  Genitourinary:  Negative for dysuria, frequency and urgency.  Musculoskeletal:  Positive for falls.  Skin:  Negative for itching and rash.  Neurological:  Negative for dizziness and headaches.  Endo/Heme/Allergies:        See allergy listing  Psychiatric/Behavioral:  Positive for depression (Stable with medication). Negative for hallucinations, substance abuse and suicidal ideas. The patient is nervous/anxious (Improved with medication). The patient does not have insomnia.    Blood pressure 105/79, pulse 92, temperature 98.4 F (36.9 C), temperature source Oral, resp. rate 16, height 5' 7 (1.702 m), weight 51.6 kg, last menstrual period 03/28/2024, SpO2 100%. Body mass index is 17.82 kg/m.  Social History   Tobacco Use  Smoking Status Never  Smokeless Tobacco Never   Tobacco Cessation:  N/A, patient does not currently use tobacco products  Blood Alcohol level:  Lab Results  Component Value Date    [facility] <15 04/19/2024   Metabolic Disorder Labs:  Lab Results  Component Value Date   HGBA1C 4.6 (L) 04/19/2024   MPG 85.32 04/19/2024   No results found for: PROLACTIN Lab Results  Component Value Date   CHOL 191 04/19/2024   TRIG 50 04/19/2024   HDL 45 04/19/2024   CHOLHDL 4.2 04/19/2024   VLDL 10 04/19/2024   LDLCALC 136 (H) 04/19/2024   LDLCALC 130 (H) 06/20/2020   See Psychiatric Specialty Exam and Suicide Risk Assessment completed by Attending Physician prior to discharge.  Discharge destination:  Home  Is patient on multiple antipsychotic therapies at discharge:  No   Has Patient had three or more failed trials of antipsychotic monotherapy by history:  No  Recommended Plan for Multiple Antipsychotic Therapies: NA  Discharge Instructions     Increase activity slowly   Complete by: As directed       Allergies as of 04/23/2024   No Known Allergies      Medication List     STOP taking these medications    diphenhydrAMINE  25 mg capsule Commonly known as: BENADRYL        TAKE these medications      Indication  hydrOXYzine  25 MG tablet Commonly known as: ATARAX  Take 1 tablet (25 mg total) by mouth 3 (three) times daily as needed for anxiety.  Indication: Feeling Anxious   traZODone  50 MG tablet Commonly known as: DESYREL  Take 1 tablet (50 mg total) by mouth at bedtime as needed for sleep.  Indication: Trouble Sleeping   venlafaxine  XR 75 MG 24 hr capsule Commonly known as: EFFEXOR -XR Take 1 capsule (75 mg total) by mouth daily with supper. What changed:  medication strength how much to take when to take this additional instructions  Indication: Major Depressive Disorder        Follow-up Information     [facility] Follow up on 2/82/7974.   Specialty: Behavioral Health Why: You have an appointment for therapy services on 05/10/24 at 3:00 pm. Contact information: [address] [place]  [address] [phone]        [facility] Follow up on 04/24/2024.   Why: Please call this provider on 04/24/24 at 9:00 am to schedule an appointment for  medication management services, as we were unable to contact the provider prior to your discharge. Contact information: [address] [place] [address] [phone]                Follow-up recommendations:   Discharge Recommendations:    The patient is being discharged with her family.  Patient is to take her discharge medications as ordered. ?See follow up above.   We recommend that she participates in individual therapy to target uncontrollable agitation and substance abuse.    We recommend that she participates in therapy to target the conflict with her family, to improve communication skills and conflict resolution skills. ?patient is to initiate/implement a contingency based behavioral model to address patient's behavior.   We recommend that she gets AIMS scale, height, weight, blood pressure, fasting lipid panel, fasting blood sugar in three months from discharge if she's on atypical antipsychotics.    Patient will benefit from monitoring of recurrent suicidal ideation since patient is on antidepressant medication.   The patient should abstain from all illicit substances and alcohol.   If the patient's symptoms worsen or do not continue to improve or if the patient becomes actively suicidal or homicidal then it is recommended that the patient return to the closest hospital emergency room or call 911 for further evaluation and treatment. National Suicide Prevention Lifeline 1800-SUICIDE or (517)068-3605.   Please follow up with your primary medical doctor for all other medical needs.    The patient has been educated on the possible side effects to medications and she/her guardian is to contact a medical professional and inform outpatient provider of any new side effects  of medication.   She is to take regular diet and activity as tolerated. ?Will benefit from moderate daily exercise.   Patient was educated about removing/locking any firearms, medications or dangerous products from the home.   Activity:  As tolerated   Diet:  Regular Diet  Signed: Ellouise JAYSON Azure, FNP 04/23/2024, 11:17 AM

## 2024-04-23 NOTE — Progress Notes (Signed)
   04/22/24 2300  Psych Admission Type (Psych Patients Only)  Admission Status Voluntary/72 hour document signed  Date 72 hour document signed  03/20/24  Time 72 hour document signed  1447  Psychosocial Assessment  Patient Complaints Anxiety  Eye Contact Fair  Facial Expression Animated  Affect Anxious  Speech Logical/coherent  Interaction Assertive  Motor Activity Fidgety  Appearance/Hygiene Unremarkable  Behavior Characteristics Cooperative  Mood Anxious  Thought Process  Coherency WDL  Content WDL  Delusions None reported or observed  Perception WDL  Hallucination None reported or observed  Judgment Impaired  Confusion None  Danger to Self  Current suicidal ideation? Denies  Description of Suicide Plan none  Self-Injurious Behavior No self-injurious ideation or behavior indicators observed or expressed   Agreement Not to Harm Self Yes  Description of Agreement verbal  Danger to Others  Danger to Others None reported or observed

## 2024-04-23 NOTE — Progress Notes (Signed)
 Pt discharged to lobby. Pt was stable and appreciative at that time. All papers and prescriptions were given and valuables returned. Verbal understanding expressed. Denies SI/HI and A/VH. Pt given opportunity to express concerns and ask questions.

## 2024-04-23 NOTE — Group Note (Signed)
 Date:  04/23/2024 Time:  1:02 AM  Group Topic/Focus:  Wrap-Up Group:   The focus of this group is to help patients review their daily goal of treatment and discuss progress on daily workbooks.    Participation Level:  Active  Participation Quality:  Sharing  Affect:  Appropriate  Cognitive:  Appropriate  Insight: Good  Engagement in Group:  Engaged  Modes of Intervention:  Discussion  Additional Comments:  Patient day was an 8. She shared how she is looking forward to discharging tomorrow.   Wakhaila H Ozell Ferrera 04/23/2024, 1:02 AM

## 2024-04-23 NOTE — [facility] Suicide Risk Assessment (Signed)
 Suicide Risk Assessment  Discharge Assessment    [facility] Discharge Suicide Risk Assessment   Principal Problem: MDD (major depressive disorder), recurrent episode, severe (HCC) Discharge Diagnoses: Principal Problem:   MDD (major depressive disorder), recurrent episode, severe (HCC)  Reason for admission:  Nicole Huff is a 33 year old African-American female with prior psychiatric diagnoses significant for MDD, recurrent episode severe, adjustment reaction with anxiety and depression, and PMHx significant  for epigastric pain.  Patient presented voluntarily to [facility] from [facility] behavioral health urgent care for complaint of worsening depression resulting in suicidal ideation in the context of recent break-up with the boyfriend of 1.5 years.    Total Time spent with patient: 45 minutes  Addendum: Patient denies any dangerousness of suicidal ideation, homicidal ideation, auditory or visual hallucination, or presence of firearms in the house.  Patient's mother Macario at [phone] called for follow-up prior to discharge.  Mother states that she visited regularity while hospitalized, and noted that her depressive symptoms are greatly improved and symptoms minimize.  Mother added that patient is safe to be discharged from the hospital and there is no firearms or weapons in the house for safety.  Musculoskeletal: Strength & Muscle Tone: within normal limits Gait & Station: Normal Patient leans: N/A  Psychiatric Specialty Exam  Presentation  General Appearance:  Appropriate for Environment; Casual; Fairly Groomed  Eye Contact: Good  Speech: Clear and Coherent  Speech Volume: Normal  Handedness: Right  Mood and Affect  Mood: Euthymic  Duration of Depression Symptoms: Greater than two weeks  Affect: Congruent  Thought Process  Thought Processes: Coherent  Descriptions of Associations:Intact  Orientation:Full (Time, Place and  Person)  Thought Content:Logical  History of Schizophrenia/Schizoaffective disorder:No  Duration of Psychotic Symptoms:No data recorded Hallucinations:Hallucinations: None  Ideas of Reference:None  Suicidal Thoughts:Suicidal Thoughts: No SI Active Intent and/or Plan: -- (Denies) SI Passive Intent and/or Plan: -- (Denies)  Homicidal Thoughts:Homicidal Thoughts: No  Sensorium  Memory: Immediate Good; Recent Good  Judgment: Fair  Insight: Fair  Art therapist  Concentration: Good  Attention Span: Good  Recall: Fair  Fund of Knowledge: Fair  Language: Good  Psychomotor Activity  Psychomotor Activity: Psychomotor Activity: Normal  Assets  Assets: Communication Skills; Desire for Improvement; Physical Health; Resilience; Social Support  Sleep  Sleep: Sleep: Good  Estimated Sleeping Duration (Last 24 Hours): 7.50-8.00 hours  Physical Exam: Physical Exam Vitals and nursing note reviewed.  Constitutional:      General: She is not in acute distress.    Appearance: She is normal weight. She is not ill-appearing.  HENT:     Head: Normocephalic.     Right Ear: External ear normal.     Left Ear: External ear normal.     Nose: Nose normal.     Mouth/Throat:     Mouth: Mucous membranes are moist.     Pharynx: Oropharynx is clear.   Cardiovascular:     Rate and Rhythm: Normal rate.     Pulses: Normal pulses.  Pulmonary:     Effort: Pulmonary effort is normal. No respiratory distress.  Abdominal:     Comments: Deferred  Genitourinary:    Comments: Deferred  Musculoskeletal:        General: Normal range of motion.     Cervical back: Normal range of motion.   Skin:    General: Skin is warm.   Neurological:     General: No focal deficit present.     Mental Status: She is  alert and oriented to person, place, and time.   Psychiatric:        Mood and Affect: Mood normal.        Behavior: Behavior normal.        Thought Content: Thought  content normal.        Judgment: Judgment normal.    Review of Systems  Constitutional:  Negative for chills and fever.  HENT:  Negative for sore throat.   Eyes:  Negative for blurred vision.  Respiratory:  Negative for cough, sputum production, shortness of breath and wheezing.   Cardiovascular:  Negative for chest pain and palpitations.  Gastrointestinal:  Negative for abdominal pain, constipation, diarrhea, heartburn, nausea and vomiting.  Genitourinary:  Negative for dysuria, frequency and urgency.  Musculoskeletal:  Negative for falls.  Skin:  Negative for itching and rash.  Neurological:  Negative for dizziness and headaches.  Endo/Heme/Allergies:        See allergy listing  Psychiatric/Behavioral:  Positive for depression (Stable with medication). Negative for hallucinations, substance abuse and suicidal ideas. The patient is nervous/anxious (Improved with medication). The patient does not have insomnia.    Blood pressure 105/79, pulse 92, temperature 98.4 F (36.9 C), temperature source Oral, resp. rate 16, height 5' 7 (1.702 m), weight 51.6 kg, last menstrual period 03/28/2024, SpO2 100%. Body mass index is 17.82 kg/m.  Mental Status Per Nursing Assessment::   On Admission:  Suicidal ideation indicated by patient, Self-harm behaviors, Self-harm thoughts  Demographic Factors:  Adolescent or young adult and Caucasian  Loss Factors: NA  Historical Factors: Prior suicide attempts and Impulsivity  Risk Reduction Factors:   Employed, Positive social support, Positive therapeutic relationship, and Positive coping skills or problem solving skills  Continued Clinical Symptoms:  Depression:   Recent sense of peace/wellbeing Previous Psychiatric Diagnoses and Treatments  Cognitive Features That Contribute To Risk:  Polarized thinking    Suicide Risk:  Mild: There are no identifiable plans, no associated intent, mild dysphoria and related symptoms, good self-control (both  objective and subjective assessment), few other risk factors, and identifiable protective factors, including available and accessible social support.   Follow-up Information     [facility] Behavioral Medicine at [facility] Follow up on 2/82/7974.   Specialty: Behavioral Health Why: You have an appointment for therapy services on 05/10/24 at 3:00 pm. Contact information: [address] [place]  [address] [phone]        [facility] Follow up on 04/24/2024.   Why: Please call this provider on 04/24/24 at 9:00 am to schedule an appointment for  medication management services, as we were unable to contact the provider prior to your discharge. Contact information: [address] [place] [address] [phone]                 Plan Of Care/Follow-up recommendations:  Discharge Recommendations:    The patient is being discharged with her family.   Patient is to take her discharge medications as ordered. ?See follow up above.   We recommend that she participates in individual therapy to target uncontrollable agitation and substance abuse.    We recommend that she participates in therapy to target the conflict with her family, to improve communication skills and conflict resolution skills. ?patient is to initiate/implement a contingency based behavioral model to address patient's behavior.   We recommend that she gets AIMS scale, height, weight, blood pressure, fasting lipid panel, fasting blood sugar in  three months from discharge if she's on atypical antipsychotics.    Patient will benefit from monitoring of recurrent suicidal ideation since patient is on antidepressant medication.   The patient should abstain from all illicit substances and alcohol.   If the patient's symptoms worsen or do not continue to improve or if the patient becomes actively suicidal or homicidal then it is recommended that the patient return  to the closest hospital emergency room or call 911 for further evaluation and treatment. National Suicide Prevention Lifeline 1800-SUICIDE or [phone].   Please follow up with your primary medical doctor for all other medical needs.    The patient has been educated on the possible side effects to medications and she/her guardian is to contact a medical professional and inform outpatient provider of any new side effects of medication.   She is to take regular diet and activity as tolerated. ?Will benefit from moderate daily exercise.   Patient was educated about removing/locking any firearms, medications or dangerous products from the home.   Activity:  As tolerated   Diet:  Regular Diet   Ellouise JAYSON Azure, FNP 04/23/2024, 11:09 AM

## 2024-04-26 ENCOUNTER — Ambulatory Visit: Admitting: Primary Care

## 2024-04-26 ENCOUNTER — Encounter: Payer: Self-pay | Admitting: Primary Care

## 2024-04-26 VITALS — BP 108/60 | HR 78 | Temp 97.9°F | Ht 67.0 in | Wt 113.0 lb

## 2024-04-26 DIAGNOSIS — F4323 Adjustment disorder with mixed anxiety and depressed mood: Secondary | ICD-10-CM

## 2024-04-26 NOTE — Patient Instructions (Signed)
 Continue taking venlafaxine  ER 75 mg daily.  Follow-up with psychiatry and therapy as scheduled.  It was a pleasure to see you today!

## 2024-04-26 NOTE — Assessment & Plan Note (Signed)
 Slight improvement. Reviewed behavioral health notes from recent admission. No alarm signs on exam today.  Continue venlafaxine  ER 75 mg daily. Follow-up with psychiatry as scheduled for next week. Follow-up with therapy as scheduled for 05/10/2024.  We discussed strict ED/behavioral health urgent care precautions.
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
## 2024-05-04 DIAGNOSIS — F413 Other mixed anxiety disorders: Secondary | ICD-10-CM | POA: Diagnosis not present

## 2024-05-04 DIAGNOSIS — F4323 Adjustment disorder with mixed anxiety and depressed mood: Secondary | ICD-10-CM | POA: Diagnosis not present

## 2024-05-04 DIAGNOSIS — F332 Major depressive disorder, recurrent severe without psychotic features: Secondary | ICD-10-CM | POA: Diagnosis not present

## 2024-05-10 ENCOUNTER — Ambulatory Visit (INDEPENDENT_AMBULATORY_CARE_PROVIDER_SITE_OTHER): Admitting: Psychology

## 2024-05-10 DIAGNOSIS — F332 Major depressive disorder, recurrent severe without psychotic features: Secondary | ICD-10-CM | POA: Diagnosis not present

## 2024-05-10 NOTE — Progress Notes (Signed)
 [facility] Counselor Initial Adult Exam  Name: Nicole Huff Date: 05/10/2024 MRN: [id number] DOB: 1991-03-17 PCP: Gretta Comer POUR, NP  Time spent: 60 mins   start time: 1500     end time: 1600  Guardian/Payee:  Pt   Paperwork requested: No   Reason for Visit /Presenting Problem: Pt presents in the office in person for the session, granting consent for the session.  Mental Status Exam: Appearance:   Casual     Behavior:  Appropriate  Motor:  Normal  Speech/Language:   Clear and Coherent  Affect:  Appropriate  Mood:  normal  Thought process:  normal  Thought content:    WNL  Sensory/Perceptual disturbances:    WNL  Orientation:  oriented to person, place, and time/date  Attention:  Good  Concentration:  Good  Memory:  WNL  Fund of knowledge:   Good  Insight:    Good  Judgment:   Good  Impulse Control:  Good   Reported Symptoms:  Pt shares she has been struggling with her mood instability lately.  She was in the [facility] hospital for 5 days earlier this year.  She is having some suicidal thoughts from time to time.  Pt is able to contract for safety and knows emergency procedures if she needs them.  Talked with pt about connecting with [facility] for a psychiatric provider.  Pt shares that she met a new boyfriend, [place]ford, in August 2023 and they started dating that Fall.  They moved in with each other in April 2024 and it was ok until the Fall.  He stopped paying the rent in October and they got evicted in Feb 2025.  He moved to [name] with a friend and would come to see her on the weekends.  She found out he was cheating on her.  Pt was and is really hurt by this relationship.    Risk Assessment: Danger to Self:  No at this point Self-injurious Behavior: No at this point Danger to Others: No Duty to Warn:no Physical Aggression / Violence:No  Access to Firearms a concern: No  Gang Involvement:No  Patient / guardian was educated about steps to take if suicide or  homicide risk level increases between visits: n/a While future psychiatric events cannot be accurately predicted, the patient does not currently require acute inpatient psychiatric care and does not currently meet [facility]  involuntary commitment criteria.  Substance Abuse History: Current substance abuse: No     Past Psychiatric History:   Previous psychological history is significant for depression; in psych hospital the week before July 4th holiday Outpatient Providers:This therapist in the past as well as others History of Psych Hospitalization: No  Psychological Testing: none   Abuse History:  Victim of: No., none   Report needed: No. Victim of Neglect:No. Perpetrator of none  Witness / Exposure to Domestic Violence: No   Protective Services Involvement: No  Witness to [employer] Violence:  No   Family History:  Family History  Problem Relation Age of Onset   Thyroid  disease Mother    Thyroid  disease Maternal Grandmother    Diabetes Maternal Grandfather     Living situation: the patient lives with their family (mom, step dad, sister)  Sexual Orientation: Straight  Relationship Status: single  Name of spouse / other:none If a parent, number of children / ages:none  Support Systems: parents; some social outings with work friends that are work related  Financial Stress:  Yes ; has a lot of problems with  credit cards, car loan, eviction from apt, etc.   Income/Employment/Disability: Employment  Financial planner: No   Educational History: Education: some college  Religion/Sprituality/World View: Protestant  Any cultural differences that may affect / interfere with treatment:  not applicable   Recreation/Hobbies: Pt enjoys being in the sun at their pool at home; sitting on the front porch; pt has been to the gym in the past and has enjoyed that; can go to her dad's apt complex to use the gym and walk with him  Stressors: Financial difficulties   Loss of  relationship    Strengths: Family and Self Advocate  Barriers:  depression   Legal History: Pending legal issue / charges: pt and [place]ford were evicted from the apt complex; she is trying to get money from [place] because he had been responsible for the rent. History of legal issue / charges: see above  Medical History/Surgical History: reviewed Past Medical History:  Diagnosis Date   Medical history non-contributory    Nausea vomiting and diarrhea 02/13/2021    Past Surgical History:  Procedure Laterality Date   NO PAST SURGERIES      Medications: Current Outpatient Medications  Medication Sig Dispense Refill   hydrOXYzine  (ATARAX ) 25 MG tablet Take 1 tablet (25 mg total) by mouth 3 (three) times daily as needed for anxiety. 30 tablet 0   traZODone  (DESYREL ) 50 MG tablet Take 1 tablet (50 mg total) by mouth at bedtime as needed for sleep. 30 tablet 0   venlafaxine  XR (EFFEXOR -XR) 75 MG 24 hr capsule Take 1 capsule (75 mg total) by mouth daily with supper. 90 capsule 0   No current facility-administered medications for this visit.    No Known Allergies  Diagnoses:  Severe episode of recurrent major depressive disorder, without psychotic features (HCC)  Plan of Care: Encouraged pt to continue with her self care activities and we will meet in 2 wks for a follow up session.   Francis KATHEE Macintosh, [facility]

## 2024-05-23 ENCOUNTER — Ambulatory Visit: Admitting: Psychology

## 2024-05-23 DIAGNOSIS — F332 Major depressive disorder, recurrent severe without psychotic features: Secondary | ICD-10-CM | POA: Diagnosis not present

## 2024-05-23 NOTE — Progress Notes (Signed)
 [facility] Counselor/Therapist Progress Note  Patient ID: Nicole Huff, MRN: [id number],    Date: 05/23/2024  Time Spent: 45 mins    start time: 1003  end time: 1050  Treatment Type: Individual Therapy  Reported Symptoms: Pt presents for session, via Caregility video, granting consent for the session.  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  I shared with pt that I am in my office with no one else here either.  Mental Status Exam: Appearance:  Casual     Behavior: Appropriate  Motor: Normal  Speech/Language:  Clear and Coherent  Affect: Appropriate  Mood: normal  Thought process: normal  Thought content:   WNL  Sensory/Perceptual disturbances:   WNL  Orientation: oriented to person, place, and time/date  Attention: Good  Concentration: Good  Memory: WNL  Fund of knowledge:  Good  Insight:   Good  Judgment:  Good  Impulse Control: Good   Risk Assessment: Danger to Self:  No Self-injurious Behavior: No Danger to Others: No Duty to Warn:no Physical Aggression / Violence:No  Access to Firearms a concern: No  Gang Involvement:No   Subjective: Pt shares that Medford has continued to send her money every Friday to pay toward their money owed on their apt rent.  She shares that she called a friend of Medford' Nicolette) and they have hung out a few times; she says they enjoy each other's company.  At first she was hoping [name] would tell Medford the bad things about Medford that she is telling [name].  She is beginning to feel differently about [name]; he is not pressing her to feel differently.  It is somewhere between a friend relationship and a distraction.  He is going through a divorce right now and so we are both in the same situations.  Pt shares that [name] is pretty good with his money and she is watching what he does with his and is seeing the benefits of that  behavior.  Pt shares that she does not have many girlfriends that she can spend time with; she has her sister and some extended family that she is going to the beach with on 8/16.  She is going down from Saturday to Tuesday.  She will be going back to the beach on Labor Day weekend with her dad's family for a long weekend.  Pt is currently living with her mom and she tries to help out in her mom's home (mom, step-dad, grandmother, step-sister and her son, and pt).  Pt shares she is working most every day of the week; she has not been calling out at all.  Pt shares that she falls asleep Ok but it is normally about midnight before she gets sleepy; she would prefer to get more sleep.  Talked with pt about deep breathing and how to use deep breathing as a means of helping her to fall asleep at night; encouraged pt to practice between now and our next session.  Again, encouraged pt to talk with the [facility] staff about how to work through her financial issues.  Encouraged pt to continue with her self care activities and we will meet in 2 wks for a follow up session.  Interventions: Cognitive Behavioral Therapy  Diagnosis:Severe episode of recurrent major depressive disorder, without psychotic features (HCC)  Plan: Treatment Plan Strengths/Abilities:  Intelligent, Intuitive, Willing to participate in therapy Treatment  Preferences:  Outpatient Individual Therapy Statement of Needs:  Patient is to use CBT, mindfulness and coping skills to help manage and/or decrease symptoms associated with their diagnosis. Symptoms:  Depressed/Irritable mood, worry, social withdrawal Problems Addressed:  Depressive thoughts, Sadness, Sleep issues, etc. Long Term Goals:  Pt to reduce overall level, frequency, and intensity of the feelings of depression as evidenced by decreased irritability, negative self talk, and helpless feelings from 6 to 7 days/week to 0 to 1 days/week, per client report, for at least 3  consecutive months.  Progress: 20% Short Term Goals:  Pt to verbally express understanding of the relationship between feelings of depression and their impact on thinking patterns and behaviors.  Pt to verbalize an understanding of the role that distorted thinking plays in creating fears, excessive worry, and ruminations.  Progress: 20% Target Date:  05/23/2025 Frequency:  Bi-weekly Modality:  Cognitive Behavioral Therapy Interventions by Therapist:  Therapist will use CBT, Mindfulness exercises, Coping skills and Referrals, as needed by client. Client has verbally approved this treatment plan.  Francis KATHEE Macintosh, [facility]

## 2024-05-25 ENCOUNTER — Ambulatory Visit: Admitting: Psychology

## 2024-05-30 DIAGNOSIS — Z113 Encounter for screening for infections with a predominantly sexual mode of transmission: Secondary | ICD-10-CM | POA: Diagnosis not present

## 2024-05-30 DIAGNOSIS — N760 Acute vaginitis: Secondary | ICD-10-CM | POA: Diagnosis not present

## 2024-06-08 ENCOUNTER — Ambulatory Visit: Admitting: Psychology

## 2024-06-08 DIAGNOSIS — F332 Major depressive disorder, recurrent severe without psychotic features: Secondary | ICD-10-CM

## 2024-06-08 NOTE — Progress Notes (Signed)
 [facility] Counselor/Therapist Progress Note  Patient ID: Nicole Huff, MRN: [id number],    Date: 06/08/2024  Time Spent: 45 mins    start time: 1400  end time: 1445  Treatment Type: Individual Therapy  Reported Symptoms: Pt presents for session, via Caregility video, granting consent for the session.  Pt shares she is in her car with no one else present; pt shares she understands the limits of virtual sessions.  I shared with pt that I am in my office with no one else here either.  Mental Status Exam: Appearance:  Casual     Behavior: Appropriate  Motor: Normal  Speech/Language:  Clear and Coherent  Affect: Appropriate  Mood: normal  Thought process: normal  Thought content:   WNL  Sensory/Perceptual disturbances:   WNL  Orientation: oriented to person, place, and time/date  Attention: Good  Concentration: Good  Memory: WNL  Fund of knowledge:  Good  Insight:   Good  Judgment:  Good  Impulse Control: Good   Risk Assessment: Danger to Self:  No Self-injurious Behavior: No Danger to Others: No Duty to Warn:no Physical Aggression / Violence:No  Access to Firearms a concern: No  Gang Involvement:No   Subjective: Pt shares that she is working a lot lately and that has been good for her.  She still enjoys the job quite a bit.  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  Pt shares that she is doing OK with keeping up with her bills; she still has expenses from her time with Nicole Huff Nicole Huff did not send her any money last week but is supposed to pay her double this week.  Pt shares that she and Nicole Huff talked about pt dating his good friend, Nicole Huff, and Nicole Huff said he would not be able to keep being friends with Nicole Huff.  Pt and Nicole Huff went to a concert together recently.  Nicole Huff went to church last Sunday and met lots of her family and they had lunch with the family.  Pt reminds me that Nicole Huff is going through a divorce right now (since October 2024).  Pt shares  that Nicole Huff is the wrestling coach at [employer] and pt describes him as being very level headed.  Pt shares she has a nail appt tomorrow, she went to see Nicole Huff in [facility] [place] this week, she is trying to stay busy.  Pt shares she feels like she still loves Nicole Huff and she is not sure how she feels about Nicole Huff.  Pt shares that her sister is stressed out because of the twin boys are poor acting and pt does not appreciate their behavior.  One of the boys got suspended from school already because he threw a rock at another person in their class.  Her other sister, who lives with pt and her mom seems to be doing OK and her son is going to be in 3rd grade.  Pt is going to the beach tomorrow with family and she is coming back on Tuesday.  She will be going back to the beach on Labor Day weekend with her dad's family for a long weekend; Nicole Huff has been invited and is likely to go.  Pt shares she is working most every day of the week; she has not been calling out at all.  Talked with pt about deep breathing and how to use deep breathing as a means of helping her to fall asleep at night; encouraged pt to practice between now and our next session.  Pt has been using deep breathing at work as a transition between pts.  Encouraged pt to continue with her self care activities and we will meet in 2 wks for a follow up session.  Interventions: Cognitive Behavioral Therapy  Diagnosis:Severe episode of recurrent major depressive disorder, without psychotic features (HCC)  Plan: Treatment Plan Strengths/Abilities:  Intelligent, Intuitive, Willing to participate in therapy Treatment Preferences:  Outpatient Individual Therapy Statement of Needs:  Patient is to use CBT, mindfulness and coping skills to help manage and/or decrease symptoms associated with their diagnosis. Symptoms:  Depressed/Irritable mood, worry, social withdrawal Problems Addressed:  Depressive thoughts, Sadness, Sleep issues, etc. Long Term Goals:  Pt to reduce  overall level, frequency, and intensity of the feelings of depression as evidenced by decreased irritability, negative self talk, and helpless feelings from 6 to 7 days/week to 0 to 1 days/week, per client report, for at least 3 consecutive months.  Progress: 20% Short Term Goals:  Pt to verbally express understanding of the relationship between feelings of depression and their impact on thinking patterns and behaviors.  Pt to verbalize an understanding of the role that distorted thinking plays in creating fears, excessive worry, and ruminations.  Progress: 20% Target Date:  05/23/2025 Frequency:  Bi-weekly Modality:  Cognitive Behavioral Therapy Interventions by Therapist:  Therapist will use CBT, Mindfulness exercises, Coping skills and Referrals, as needed by client. Client has verbally approved this treatment plan.  Nicole Huff, [facility]               Nicole Huff, [facility]

## 2024-06-22 ENCOUNTER — Ambulatory Visit: Admitting: Psychology

## 2024-06-22 DIAGNOSIS — F332 Major depressive disorder, recurrent severe without psychotic features: Secondary | ICD-10-CM | POA: Diagnosis not present

## 2024-06-22 NOTE — Progress Notes (Signed)
 [facility] Counselor/Therapist Progress Note  Patient ID: SAMAN GIDDENS, MRN: [id number],    Date: 06/22/2024  Time Spent: 45 mins    start time: 1100  end time: 1145  Treatment Type: Individual Therapy  Reported Symptoms: Pt presents for session, via Caregility video, granting consent for the session.  Pt shares she is in the condo they are staying in at the beach with no one else present; pt shares she understands the limits of virtual sessions.  I shared with pt that I am in my office with no one else here either.  Mental Status Exam: Appearance:  Casual     Behavior: Appropriate  Motor: Normal  Speech/Language:  Clear and Coherent  Affect: Appropriate  Mood: normal  Thought process: normal  Thought content:   WNL  Sensory/Perceptual disturbances:   WNL  Orientation: oriented to person, place, and time/date  Attention: Good  Concentration: Good  Memory: WNL  Fund of knowledge:  Good  Insight:   Good  Judgment:  Good  Impulse Control: Good   Risk Assessment: Danger to Self:  No Self-injurious Behavior: No Danger to Others: No Duty to Warn:no Physical Aggression / Violence:No  Access to Firearms a concern: No  Gang Involvement:No   Subjective: Pt shares that she is at the beach this morning; she and [name] got to the beach late last night and her dad's family is coming down this morning.  Medford missed sending her money (125.00 per week) now for the past 3 wks now.  He did apologize to her but he said he could not send her money last week.  He did tell her that she is no longer a priority for him and that was hurtful for pt.  Last weekend, she and [name] spent the weekend together and that went really well.  She is now all about [name].  Last Saturday night, she got a collect call from a jail in [name] and it turned out to be [place]; he would not tell her why he was arrested; he kept telling her that she could Google it and figure it out.  Medford never told her why  he was calling her.  This past Monday, he called her back and told her he was going to be transferred to another facility.  She did look up what he got charged for and it was for non payment of child support.  He told her that he wanted to reconcile with her and asked her not to get serious with [name].  Pt shares that a healthy relationship (with [name]) is so new to me that I don't know what to think of it.  Pt is struggling to consider what she wants to do moving forward (staying with [facility] or going back to [place]).  [name] and Medford have been best friends for 20 yrs or so.  Pt shares that work is going well for her.  Encouraged pt to continue with her self care activities and we will meet in 3 wks for a follow up session, due to my vacation.  Interventions: Cognitive Behavioral Therapy  Diagnosis:Severe episode of recurrent major depressive disorder, without psychotic features (HCC)  Plan: Treatment Plan Strengths/Abilities:  Intelligent, Intuitive, Willing to participate in therapy Treatment Preferences:  Outpatient Individual Therapy Statement of Needs:  Patient is to use CBT, mindfulness and coping skills to help manage and/or decrease symptoms associated with their diagnosis. Symptoms:  Depressed/Irritable mood, worry, social withdrawal Problems Addressed:  Depressive thoughts, Sadness, Sleep issues,  etc. Long Term Goals:  Pt to reduce overall level, frequency, and intensity of the feelings of depression as evidenced by decreased irritability, negative self talk, and helpless feelings from 6 to 7 days/week to 0 to 1 days/week, per client report, for at least 3 consecutive months.  Progress: 20% Short Term Goals:  Pt to verbally express understanding of the relationship between feelings of depression and their impact on thinking patterns and behaviors.  Pt to verbalize an understanding of the role that distorted thinking plays in creating fears, excessive worry, and ruminations.  Progress:  20% Target Date:  05/23/2025 Frequency:  Bi-weekly Modality:  Cognitive Behavioral Therapy Interventions by Therapist:  Therapist will use CBT, Mindfulness exercises, Coping skills and Referrals, as needed by client. Client has verbally approved this treatment plan.  Francis KATHEE Macintosh, [facility]

## 2024-07-11 DIAGNOSIS — S161XXA Strain of muscle, fascia and tendon at neck level, initial encounter: Secondary | ICD-10-CM | POA: Diagnosis not present

## 2024-07-13 ENCOUNTER — Ambulatory Visit: Admitting: Psychology

## 2024-07-13 DIAGNOSIS — F332 Major depressive disorder, recurrent severe without psychotic features: Secondary | ICD-10-CM | POA: Diagnosis not present

## 2024-07-13 NOTE — Progress Notes (Addendum)
 [facility] Counselor/Therapist Progress Note  Patient ID: ELFREIDA HEGGS, MRN: [id number],    Date: 07/13/2024  Time Spent: 45 mins    start time: 1400  end time: 1445  Treatment Type: Individual Therapy  Reported Symptoms: Pt presents for session, via Caregility video, granting consent for the session.  Pt shares she is in her room at home with no one else present; pt shares she understands the limits of virtual sessions.  I shared with pt that I am in my office with no one else here either.  Mental Status Exam: Appearance:  Casual     Behavior: Appropriate  Motor: Normal  Speech/Language:  Clear and Coherent  Affect: Appropriate  Mood: normal  Thought process: normal  Thought content:   WNL  Sensory/Perceptual disturbances:   WNL  Orientation: oriented to person, place, and time/date  Attention: Good  Concentration: Good  Memory: WNL  Fund of knowledge:  Good  Insight:   Good  Judgment:  Good  Impulse Control: Good   Risk Assessment: Danger to Self:  No Self-injurious Behavior: No Danger to Others: No Duty to Warn:no Physical Aggression / Violence:No  Access to Firearms a concern: No  Gang Involvement:No   Subjective: Pt shares that they had a great time at the beach while they were there.  Pt shares that she and [name] are doing well; I don't think I want to marry him so I am not sure what I should now.  I don't know if I want to continue seeing him or not.  Pt is going to [name] to see [name] this weekend; his sister is there and she will meet her for the first time this weekend.  She is going with [name] next weekend to a wedding in [place] and she will meet his mom and brother for the first time there too.  Meeting his family and friends is a bit of a thing for her since she is not sure what she wants out of the relationship.  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  She is not sure if she really wants to leave; she does know one employee who works at the new practice and she really likes it there.  Pt shares that she has still heard from [place] and he is supposed to be getting out of jail today; he has been in jail for lack of payment of child support.  Pt shares that they dated for almost a year before she found out he had kids.  Pt shares that her mom and step dad are doing well; her dad is doing well too.  Encouraged pt to continue with her self care activities and we will meet in 3 wks for a follow up session.  Interventions: Cognitive Behavioral Therapy  Diagnosis:Severe episode of recurrent major depressive disorder, without psychotic features (HCC)  Plan: Treatment Plan Strengths/Abilities:  Intelligent, Intuitive, Willing to participate in therapy Treatment Preferences:  Outpatient Individual Therapy Statement of Needs:  Patient is to use CBT, mindfulness and coping skills to help manage and/or decrease symptoms associated with their diagnosis. Symptoms:  Depressed/Irritable mood, worry, social withdrawal Problems Addressed:  Depressive thoughts, Sadness, Sleep issues, etc. Long Term Goals:  Pt to reduce overall level, frequency, and intensity of the feelings of depression  as evidenced by decreased irritability, negative self talk, and helpless feelings from 6 to 7 days/week to 0 to 1 days/week, per client report, for at least 3 consecutive months.  Progress: 20% Short Term Goals:  Pt to verbally express understanding of the relationship between feelings of depression and their impact on thinking patterns and behaviors.  Pt to verbalize an understanding of the role that distorted thinking plays in creating fears, excessive worry, and ruminations.  Progress: 20% Target Date:  05/23/2025 Frequency:  Bi-weekly Modality:  Cognitive Behavioral Therapy Interventions by Therapist:   Therapist will use CBT, Mindfulness exercises, Coping skills and Referrals, as needed by client. Client has verbally approved this treatment plan.  Francis KATHEE Macintosh, [facility]

## 2024-07-25 DIAGNOSIS — M5412 Radiculopathy, cervical region: Secondary | ICD-10-CM | POA: Diagnosis not present

## 2024-07-25 DIAGNOSIS — M542 Cervicalgia: Secondary | ICD-10-CM | POA: Diagnosis not present

## 2024-07-25 DIAGNOSIS — S161XXD Strain of muscle, fascia and tendon at neck level, subsequent encounter: Secondary | ICD-10-CM | POA: Diagnosis not present

## 2024-08-03 ENCOUNTER — Ambulatory Visit: Admitting: Psychology

## 2024-08-03 DIAGNOSIS — F332 Major depressive disorder, recurrent severe without psychotic features: Secondary | ICD-10-CM | POA: Diagnosis not present

## 2024-08-03 DIAGNOSIS — M542 Cervicalgia: Secondary | ICD-10-CM | POA: Diagnosis not present

## 2024-08-03 NOTE — Progress Notes (Signed)
 [facility] Counselor/Therapist Progress Note  Patient ID: Nicole Huff, MRN: [id number],    Date: 08/03/2024  Time Spent: 45 mins    start time: 1400  end time: 1445  Treatment Type: Individual Therapy  Reported Symptoms: Pt presents for session, via Caregility video, granting consent for the session.  Pt shares she is in her room at home with no one else present; pt shares she understands the limits of virtual sessions.  I shared with pt that I am in my office with no one else here either.  Mental Status Exam: Appearance:  Casual     Behavior: Appropriate  Motor: Normal  Speech/Language:  Clear and Coherent  Affect: Appropriate  Mood: normal  Thought process: normal  Thought content:   WNL  Sensory/Perceptual disturbances:   WNL  Orientation: oriented to person, place, and time/date  Attention: Good  Concentration: Good  Memory: WNL  Fund of knowledge:  Good  Insight:   Good  Judgment:  Good  Impulse Control: Good   Risk Assessment: Danger to Self:  No Self-injurious Behavior: No Danger to Others: No Duty to Warn:no Physical Aggression / Violence:No  Access to Firearms a concern: No  Gang Involvement:No   Subjective: Pt shares that I have had a bit of a rough week.  I ended up helping Medford get out of jail (was in for non-payment of child support); he told me the first thing he was going to do was to come see me but he never did.  [name] did not want me to help Medford because he knew it was not going to be good for me.  I helped Medford get a car from an old boyfriend and I had him sign a document that said he would pay for the car and get it insured.  Pt put a tracker on the car and she could tell that he was staying the night at his girlfriend's house.  Pt got mad and she and her sister drove to [place], [name] to get the car back.  They stopped to talk to a cop who was going to go with them to get the car back.  Pt did not listen to the cop and was  arrested because she did not follow his instructions; she has to go to court at the end of the month.  She did not pay her own car payment so it got repossessed.  [name] is now mad at pt because she is helping Medford out.  Pt missed work yesterday because she had to get another car to drive.  She has filed for bankruptcy because she has over 100K in debt.  Pt shares that she has an attorney who is representing her with the bankruptcy.  Pt is working with the bank to try to get her car back.  Pt has been talking about her dad and she feels like she can trust him in these matters.  Talked with pt about the situation in [name] and what she thinks she may have done wrong and what she could have done differently.  Pt shares that she knows that Medford is not someone that she can trust.  Pt has continued talking with Medford, as recently as today.  [name] Medford' former best friend, does not want pt to been engaging with Medford at all.  Encouraged pt to continue with her self care activities and we will meet in 3 wks for a follow up session.  Interventions: Cognitive Behavioral Therapy  Diagnosis:Severe  episode of recurrent major depressive disorder, without psychotic features [facility])  Plan: Treatment Plan Strengths/Abilities:  Intelligent, Intuitive, Willing to participate in therapy Treatment Preferences:  Outpatient Individual Therapy Statement of Needs:  Patient is to use CBT, mindfulness and coping skills to help manage and/or decrease symptoms associated with their diagnosis. Symptoms:  Depressed/Irritable mood, worry, social withdrawal Problems Addressed:  Depressive thoughts, Sadness, Sleep issues, etc. Long Term Goals:  Pt to reduce overall level, frequency, and intensity of the feelings of depression as evidenced by decreased irritability, negative self talk, and helpless feelings from 6 to 7 days/week to 0 to 1 days/week, per client report, for at least 3 consecutive months.  Progress: 20% Short Term Goals:  Pt  to verbally express understanding of the relationship between feelings of depression and their impact on thinking patterns and behaviors.  Pt to verbalize an understanding of the role that distorted thinking plays in creating fears, excessive worry, and ruminations.  Progress: 20% Target Date:  05/23/2025 Frequency:  Bi-weekly Modality:  Cognitive Behavioral Therapy Interventions by Therapist:  Therapist will use CBT, Mindfulness exercises, Coping skills and Referrals, as needed by client. Client has verbally approved this treatment plan.  Francis KATHEE Macintosh, [facility]

## 2024-08-06 DIAGNOSIS — F4323 Adjustment disorder with mixed anxiety and depressed mood: Secondary | ICD-10-CM

## 2024-08-06 MED ORDER — HYDROXYZINE HCL 25 MG PO TABS: 25.0000 mg | ORAL_TABLET | Freq: Three times a day (TID) | ORAL | 0 refills | Status: AC | PRN

## 2024-08-24 ENCOUNTER — Ambulatory Visit (INDEPENDENT_AMBULATORY_CARE_PROVIDER_SITE_OTHER): Admitting: Psychology

## 2024-08-24 DIAGNOSIS — F332 Major depressive disorder, recurrent severe without psychotic features: Secondary | ICD-10-CM | POA: Diagnosis not present

## 2024-08-24 NOTE — Progress Notes (Signed)
 [facility] Counselor/Therapist Progress Note  Patient ID: Nicole Huff, MRN: [id number],    Date: 08/24/2024  Time Spent: 45 mins    start time: 1300  end time: 1345  Treatment Type: Individual Therapy  Reported Symptoms: Pt presents for session, via Caregility video, granting consent for the session.  Pt shares she is in her room at home with no one else present; pt shares she understands the limits of virtual sessions.  I shared with pt that I am in my office with no one else here either.  Mental Status Exam: Appearance:  Casual     Behavior: Appropriate  Motor: Normal  Speech/Language:  Clear and Coherent  Affect: Appropriate  Mood: normal  Thought process: normal  Thought content:   WNL  Sensory/Perceptual disturbances:   WNL  Orientation: oriented to person, place, and time/date  Attention: Good  Concentration: Good  Memory: WNL  Fund of knowledge:  Good  Insight:   Good  Judgment:  Good  Impulse Control: Good   Risk Assessment: Danger to Self:  No Self-injurious Behavior: No Danger to Others: No Duty to Warn:no Physical Aggression / Violence:No  Access to Firearms a concern: No  Gang Involvement:No   Subjective: Pt shares that I have been really busy at work since our last session.  That has been good because it keeps me focused and keeps my mind from wandering.  Pt shares she went to court on Monday and they waved jail time.  The judge was nice and encouraged pt to get an attorney and pushed the case out to Oct 31, 2023.  Pt shares that [facility] has not sent her any money yet, even though pt helped bail him out of jail.  [name] does not want her to have any contact with [facility] and got frustrated with her when she reached out to [place] for money.  Pt shares that she would still like more communication with [facility]ford, even though he has been ignoring her and still owes her money.  She knows that Medford often sets her up to be the bad guy in their situation.   Medford and [name] have been friends for 20 yrs but have had a falling out since pt and [name] started seeing each other.  [name] is willing to be the go between pt and Medford so that she does not have to interact with Medford.   Pt shares that she has continued to send her money a few weeks ago; she has not sent him any money in the past couple of weeks.  Pt shares that she was able to get her car back last week because the bank worked with her; she borrowed $1500.00 from her grandmother and has a plan to pay her back; she has already repaid $500.00 to her grandmother.  She will pay her the balance in November and January.  She has filed for bankruptcy because she has over 100K in debt.  Pt shares that she has an attorney who is representing her with the bankruptcy.  Pt shares she has been going to the gym weekly with a friend, she is planning to register for school in November for the Spring semester.  Pt shares that she normally takes a benadryl  each night to help her fall asleep and to help with allergy symptoms.  Pt shares that her family is planning a trip to [place] for Thanksgiving; [name] has never been there so she is excited to share that with him.  Encouraged pt  to continue with her self care activities and we will meet in 3 wks for a follow up session.  Interventions: Cognitive Behavioral Therapy  Diagnosis:Severe episode of recurrent major depressive disorder, without psychotic features (HCC)  Plan: Treatment Plan Strengths/Abilities:  Intelligent, Intuitive, Willing to participate in therapy Treatment Preferences:  Outpatient Individual Therapy Statement of Needs:  Patient is to use CBT, mindfulness and coping skills to help manage and/or decrease symptoms associated with their diagnosis. Symptoms:  Depressed/Irritable mood, worry, social withdrawal Problems Addressed:  Depressive thoughts, Sadness, Sleep issues, etc. Long Term Goals:  Pt to reduce overall level, frequency, and intensity of  the feelings of depression as evidenced by decreased irritability, negative self talk, and helpless feelings from 6 to 7 days/week to 0 to 1 days/week, per client report, for at least 3 consecutive months.  Progress: 20% Short Term Goals:  Pt to verbally express understanding of the relationship between feelings of depression and their impact on thinking patterns and behaviors.  Pt to verbalize an understanding of the role that distorted thinking plays in creating fears, excessive worry, and ruminations.  Progress: 20% Target Date:  05/23/2025 Frequency:  Bi-weekly Modality:  Cognitive Behavioral Therapy Interventions by Therapist:  Therapist will use CBT, Mindfulness exercises, Coping skills and Referrals, as needed by client. Client has verbally approved this treatment plan.  Francis KATHEE Macintosh, [facility]

## 2024-09-14 ENCOUNTER — Ambulatory Visit (INDEPENDENT_AMBULATORY_CARE_PROVIDER_SITE_OTHER): Admitting: Psychology

## 2024-09-14 DIAGNOSIS — F332 Major depressive disorder, recurrent severe without psychotic features: Secondary | ICD-10-CM | POA: Diagnosis not present

## 2024-09-14 NOTE — Progress Notes (Signed)
 [facility] Counselor/Therapist Progress Note  Patient ID: JAYLEEN SCAGLIONE, MRN: [id number],    Date: 09/14/2024  Time Spent: 32 mins    start time: 1400  end time: 1432  Treatment Type: Individual Therapy  Reported Symptoms: Pt presents for session, via Caregility video, granting consent for the session.  Pt shares she is in her room at home with no one else present; pt shares she understands the limits of virtual sessions.  I shared with pt that I am in my office with no one else here either.  Mental Status Exam: Appearance:  Casual     Behavior: Appropriate  Motor: Normal  Speech/Language:  Clear and Coherent  Affect: Appropriate  Mood: normal  Thought process: normal  Thought content:   WNL  Sensory/Perceptual disturbances:   WNL  Orientation: oriented to person, place, and time/date  Attention: Good  Concentration: Good  Memory: WNL  Fund of knowledge:  Good  Insight:   Good  Judgment:  Good  Impulse Control: Good   Risk Assessment: Danger to Self:  No Self-injurious Behavior: No Danger to Others: No Duty to Warn:no Physical Aggression / Violence:No  Access to Firearms a concern: No  Gang Involvement:No   Subjective: Pt shares that I have been really busy at work since our last session.  We are working Mon-Wed at noon and then I have the rest of the week off.  We are eating Thanksgiving lunch with my mom and then I am going with [name] and my dad's side of the family to [facility], [place] until Sunday.  We are looking forward to going to a dinner show while we are there.  Pt is excited to introduce [name] to everything there since he has never been there before.  She shares that she is moving her belongings from the old [employer] storage unit into a unit of her own this weekend.  The new [name] is happy that she is getting untangled from the old [name] and Lacey.  Pt shares that she and [name] are doing well and she is happy about that.  Pt shares that her follow  up court date in [name] is 10/30/24; she has an attorney to represent her in the case.  Pt shares that she is very glad to have her [name] back. She has filed for bankruptcy because she has over 100K in debt.  Pt shares she has been going to the gym 2-3 times per week with a friend.  She has registered for Biology and [name] and a couple of other mini-mester classes for the Spring semester.  Encouraged pt to continue with her self care activities and we will meet in 3 wks for a follow up session.  Interventions: Cognitive Behavioral Therapy  Diagnosis:Severe episode of recurrent major depressive disorder, without psychotic features (HCC)  Plan: Treatment Plan Strengths/Abilities:  Intelligent, Intuitive, Willing to participate in therapy Treatment Preferences:  Outpatient Individual Therapy Statement of Needs:  Patient is to use CBT, mindfulness and coping skills to help manage and/or decrease symptoms associated with their diagnosis. Symptoms:  Depressed/Irritable mood, worry, social withdrawal Problems Addressed:  Depressive thoughts, Sadness, Sleep issues, etc. Long Term Goals:  Pt to reduce overall level, frequency, and intensity of the feelings of depression as evidenced by decreased irritability, negative self talk, and helpless feelings from 6 to 7 days/week to 0 to 1 days/week, per client report, for at least 3 consecutive months.  Progress: 20% Short Term Goals:  Pt to verbally express understanding  of the relationship between feelings of depression and their impact on thinking patterns and behaviors.  Pt to verbalize an understanding of the role that distorted thinking plays in creating fears, excessive worry, and ruminations.  Progress: 20% Target Date:  05/23/2025 Frequency:  Bi-weekly Modality:  Cognitive Behavioral Therapy Interventions by Therapist:  Therapist will use CBT, Mindfulness exercises, Coping skills and Referrals, as needed by client. Client has verbally approved this treatment  plan.  Francis KATHEE Macintosh, [facility]

## 2024-10-05 ENCOUNTER — Ambulatory Visit: Admitting: Psychology

## 2024-10-05 DIAGNOSIS — F4323 Adjustment disorder with mixed anxiety and depressed mood: Secondary | ICD-10-CM | POA: Diagnosis not present

## 2024-10-05 NOTE — Progress Notes (Signed)
 [facility] Counselor/Therapist Progress Note  Patient ID: Nicole Huff, MRN: [id number],    Date: 10/05/2024  Time Spent: 38 mins    start time: 1400  end time: 1438  Treatment Type: Individual Therapy  Reported Symptoms: Pt presents for session, via Caregility video, granting consent for the session.  Pt shares she is in her car with no one else present; pt shares she understands the limits of virtual sessions.  I shared with pt that I am in my office with no one else here either.  Mental Status Exam: Appearance:  Casual     Behavior: Appropriate  Motor: Normal  Speech/Language:  Clear and Coherent  Affect: Appropriate  Mood: normal  Thought process: normal  Thought content:   WNL  Sensory/Perceptual disturbances:   WNL  Orientation: oriented to person, place, and time/date  Attention: Good  Concentration: Good  Memory: WNL  Fund of knowledge:  Good  Insight:   Good  Judgment:  Good  Impulse Control: Good   Risk Assessment: Danger to Self:  No Self-injurious Behavior: No Danger to Others: No Duty to Warn:no Physical Aggression / Violence:No  Access to Firearms a concern: No  Gang Involvement:No   Subjective: Pt shares that I have been good since our last session.  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  We had a nice time in [name] over Thanksgiving weekend.  Work is going well; our production designer, theatre/television/film was out last week and our international aid/development worker is on maternity leave so that leaves me in charge.  Pt shares that they really appreciate what she is doing for the office and they have bought her breakfast twice last week.  Pt shares that she and Nicole Huff are supposed to go their office Christmas party tomorrow night at [place]; Nicole Huff is currently sick and has a wrestling match with his team tomorrow so she is not sure if he can go.  Pt shares that she has not yet hired an attorney to represent her in court on 10/30/24; she does not have the $1500.00 to pay him; she  plans to talk with her dad about what is best to do.  Pt shares that she is very glad to have her [name] back. She has filed for bankruptcy because she has over 100K in debt; the attorney's office is working on the case and they are supposed to reach out to her in the next few weeks.  Pt shares she has been going to the gym 2-3 times per week with a friend and she is happy with the results she is getting so far.  Pt continues to live with her mom; her sister and her niece lives with her mom too; her sister (Nicole Huff) is on drugs and is also pregnant; she does not discipline her son well; it is a really sad situation.  Pt shares that her mom wants Nicole Huff to move out.  She shares that Nicole Huff has had 3 or 4 abortions so far and makes really bad decisions.  Pt is going on a cruise next month with lots of women; 3 night cruise leaving 1/23.  Pt is spending time with family over Christmas; she will be working some too which will be good for her.  She has registered for Biology and Nicole Huff and a couple of other mini-mester classes for the Spring semester at [name].  Encouraged pt to continue with her self care activities and we will meet in 5 wks for a follow up session, due to the  holidays.  Interventions: Cognitive Behavioral Therapy  Diagnosis:Adjustment reaction with anxiety and depression  Plan: Treatment Plan Strengths/Abilities:  Intelligent, Intuitive, Willing to participate in therapy Treatment Preferences:  Outpatient Individual Therapy Statement of Needs:  Patient is to use CBT, mindfulness and coping skills to help manage and/or decrease symptoms associated with their diagnosis. Symptoms:  Depressed/Irritable mood, worry, social withdrawal Problems Addressed:  Depressive thoughts, Sadness, Sleep issues, etc. Long Term Goals:  Pt to reduce overall level, frequency, and intensity of the feelings of depression as evidenced by decreased irritability, negative self talk, and helpless feelings from 6 to 7 days/week to 0  to 1 days/week, per client report, for at least 3 consecutive months.  Progress: 20% Short Term Goals:  Pt to verbally express understanding of the relationship between feelings of depression and their impact on thinking patterns and behaviors.  Pt to verbalize an understanding of the role that distorted thinking plays in creating fears, excessive worry, and ruminations.  Progress: 20% Target Date:  05/23/2025 Frequency:  Bi-weekly Modality:  Cognitive Behavioral Therapy Interventions by Therapist:  Therapist will use CBT, Mindfulness exercises, Coping skills and Referrals, as needed by client. Client has verbally approved this treatment plan.  Francis KATHEE Macintosh, [facility]

## 2024-10-12 ENCOUNTER — Ambulatory Visit: Admitting: Primary Care

## 2024-10-12 ENCOUNTER — Ambulatory Visit: Payer: Self-pay

## 2024-10-12 VITALS — BP 110/70 | HR 60 | Temp 98.0°F | Ht 67.0 in | Wt 119.8 lb

## 2024-10-12 DIAGNOSIS — T169XXA Foreign body in ear, unspecified ear, initial encounter: Secondary | ICD-10-CM | POA: Insufficient documentation

## 2024-10-12 DIAGNOSIS — T162XXA Foreign body in left ear, initial encounter: Secondary | ICD-10-CM

## 2024-10-12 NOTE — Assessment & Plan Note (Signed)
 Patient consented to removal of hair to left canal. Left canal initially irrigated without successful removal of hair. Small forceps were used to remove hair to left canal which was successful. Patient tolerated well. No adverse outcomes.
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
## 2024-10-12 NOTE — Telephone Encounter (Signed)
 Patient at clinic for 3:40 appt    Reason for Disposition  Patient already left for the hospital/clinic.  Answer Assessment - Initial Assessment Questions Patient current in appt with PCP  Protocols used: No Contact or Duplicate Contact Call-A-AH

## 2024-10-12 NOTE — Patient Instructions (Signed)
 It was a pleasure to see you today!

## 2024-11-09 ENCOUNTER — Ambulatory Visit: Admitting: Psychology

## 2024-11-09 DIAGNOSIS — F4323 Adjustment disorder with mixed anxiety and depressed mood: Secondary | ICD-10-CM
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
## 2024-12-14 ENCOUNTER — Ambulatory Visit: Admitting: Psychology
